# Patient Record
Sex: Male | Born: 1955
Health system: Southern US, Community
[De-identification: ages and names within clinical notes are randomized; demographics above are authoritative.]

## PROBLEM LIST (undated history)

## (undated) ENCOUNTER — Emergency Department (HOSPITAL_COMMUNITY): Admission: EM | Source: Home / Self Care

## (undated) DIAGNOSIS — E785 Hyperlipidemia, unspecified: Secondary | ICD-10-CM

## (undated) DIAGNOSIS — Z21 Asymptomatic human immunodeficiency virus [HIV] infection status: Secondary | ICD-10-CM

## (undated) DIAGNOSIS — E7800 Pure hypercholesterolemia, unspecified: Secondary | ICD-10-CM

## (undated) DIAGNOSIS — G4700 Insomnia, unspecified: Secondary | ICD-10-CM

## (undated) DIAGNOSIS — N529 Male erectile dysfunction, unspecified: Secondary | ICD-10-CM

## (undated) DIAGNOSIS — M1990 Unspecified osteoarthritis, unspecified site: Secondary | ICD-10-CM

## (undated) DIAGNOSIS — N4889 Other specified disorders of penis: Secondary | ICD-10-CM

## (undated) DIAGNOSIS — N182 Chronic kidney disease, stage 2 (mild): Secondary | ICD-10-CM

## (undated) DIAGNOSIS — K219 Gastro-esophageal reflux disease without esophagitis: Secondary | ICD-10-CM

## (undated) DIAGNOSIS — F325 Major depressive disorder, single episode, in full remission: Secondary | ICD-10-CM

## (undated) DIAGNOSIS — J189 Pneumonia, unspecified organism: Secondary | ICD-10-CM

## (undated) DIAGNOSIS — R7303 Prediabetes: Secondary | ICD-10-CM

## (undated) DIAGNOSIS — R739 Hyperglycemia, unspecified: Secondary | ICD-10-CM

## (undated) DIAGNOSIS — I2510 Atherosclerotic heart disease of native coronary artery without angina pectoris: Secondary | ICD-10-CM

## (undated) DIAGNOSIS — F419 Anxiety disorder, unspecified: Secondary | ICD-10-CM

## (undated) DIAGNOSIS — F32A Depression, unspecified: Secondary | ICD-10-CM

## (undated) DIAGNOSIS — B1920 Unspecified viral hepatitis C without hepatic coma: Secondary | ICD-10-CM

## (undated) DIAGNOSIS — F329 Major depressive disorder, single episode, unspecified: Secondary | ICD-10-CM

## (undated) DIAGNOSIS — I10 Essential (primary) hypertension: Secondary | ICD-10-CM

## (undated) DIAGNOSIS — Z6837 Body mass index (BMI) 37.0-37.9, adult: Secondary | ICD-10-CM

## (undated) DIAGNOSIS — K7460 Unspecified cirrhosis of liver: Secondary | ICD-10-CM

## (undated) DIAGNOSIS — B20 Human immunodeficiency virus [HIV] disease: Secondary | ICD-10-CM

## (undated) HISTORY — DX: Morbid (severe) obesity due to excess calories: E66.01

## (undated) HISTORY — DX: Pneumonia, unspecified organism: J18.9

## (undated) HISTORY — DX: Anxiety disorder, unspecified: F41.9

## (undated) HISTORY — DX: Major depressive disorder, single episode, in full remission: F32.5

## (undated) HISTORY — DX: Insomnia, unspecified: G47.00

## (undated) HISTORY — PX: CHOLECYSTECTOMY: SHX55

## (undated) HISTORY — DX: Hyperlipidemia, unspecified: E78.5

## (undated) HISTORY — DX: Gastro-esophageal reflux disease without esophagitis: K21.9

## (undated) HISTORY — DX: Prediabetes: R73.03

## (undated) HISTORY — DX: Unspecified osteoarthritis, unspecified site: M19.90

## (undated) HISTORY — DX: Asymptomatic human immunodeficiency virus (hiv) infection status: Z21

## (undated) HISTORY — DX: Body mass index (BMI) 37.0-37.9, adult: Z68.37

## (undated) HISTORY — DX: Atherosclerotic heart disease of native coronary artery without angina pectoris: I25.10

## (undated) HISTORY — DX: Essential (primary) hypertension: I10

## (undated) HISTORY — DX: Major depressive disorder, single episode, unspecified: F32.9

## (undated) HISTORY — DX: Pure hypercholesterolemia, unspecified: E78.00

## (undated) HISTORY — DX: Human immunodeficiency virus (HIV) disease: B20

## (undated) HISTORY — DX: Chronic kidney disease, stage 2 (mild): N18.2

## (undated) HISTORY — DX: Unspecified viral hepatitis C without hepatic coma: B19.20

## (undated) HISTORY — DX: Other specified disorders of penis: N48.89

## (undated) HISTORY — DX: Unspecified cirrhosis of liver: K74.60

## (undated) HISTORY — DX: Hyperglycemia, unspecified: R73.9

## (undated) HISTORY — DX: Male erectile dysfunction, unspecified: N52.9

## (undated) HISTORY — DX: Depression, unspecified: F32.A

---

## 2005-02-19 ENCOUNTER — Ambulatory Visit: Payer: Self-pay | Admitting: Family Medicine

## 2005-02-20 ENCOUNTER — Ambulatory Visit: Payer: Self-pay | Admitting: Internal Medicine

## 2005-11-22 ENCOUNTER — Ambulatory Visit: Payer: Self-pay | Admitting: Internal Medicine

## 2005-12-20 ENCOUNTER — Ambulatory Visit: Payer: Self-pay | Admitting: Internal Medicine

## 2006-02-26 ENCOUNTER — Ambulatory Visit: Payer: Self-pay | Admitting: Internal Medicine

## 2006-03-29 ENCOUNTER — Ambulatory Visit: Payer: Self-pay | Admitting: Internal Medicine

## 2006-03-29 LAB — CONVERTED CEMR LAB
Cholesterol: 189 mg/dL
HDL: 78 mg/dL
LDL Cholesterol: 89 mg/dL
Triglycerides: 110 mg/dL

## 2006-05-21 ENCOUNTER — Emergency Department (HOSPITAL_COMMUNITY): Admission: EM | Admit: 2006-05-21 | Discharge: 2006-05-21 | Payer: Self-pay | Admitting: Emergency Medicine

## 2006-05-28 ENCOUNTER — Ambulatory Visit: Payer: Self-pay | Admitting: Internal Medicine

## 2006-05-28 ENCOUNTER — Ambulatory Visit: Payer: Self-pay | Admitting: *Deleted

## 2006-06-03 ENCOUNTER — Ambulatory Visit: Payer: Self-pay | Admitting: Internal Medicine

## 2006-06-04 ENCOUNTER — Ambulatory Visit: Admission: RE | Admit: 2006-06-04 | Discharge: 2006-06-04 | Payer: Self-pay | Admitting: Internal Medicine

## 2006-07-17 ENCOUNTER — Encounter (INDEPENDENT_AMBULATORY_CARE_PROVIDER_SITE_OTHER): Payer: Self-pay | Admitting: Specialist

## 2006-07-17 ENCOUNTER — Ambulatory Visit (HOSPITAL_COMMUNITY): Admission: RE | Admit: 2006-07-17 | Discharge: 2006-07-17 | Payer: Self-pay | Admitting: General Surgery

## 2006-10-02 ENCOUNTER — Ambulatory Visit: Payer: Self-pay | Admitting: Internal Medicine

## 2006-10-02 LAB — CONVERTED CEMR LAB
CD4 % Helper T Cell: 15 %
CD4 T Cell Abs: 203
HCV Ab: POSITIVE
HIV 1 RNA Quant: 626 copies/mL
HIV-1 RNA Quant, Log: 2.8

## 2007-03-24 ENCOUNTER — Encounter (INDEPENDENT_AMBULATORY_CARE_PROVIDER_SITE_OTHER): Payer: Self-pay | Admitting: Internal Medicine

## 2007-03-24 DIAGNOSIS — K219 Gastro-esophageal reflux disease without esophagitis: Secondary | ICD-10-CM | POA: Insufficient documentation

## 2007-03-24 DIAGNOSIS — K8020 Calculus of gallbladder without cholecystitis without obstruction: Secondary | ICD-10-CM | POA: Insufficient documentation

## 2007-03-24 DIAGNOSIS — B20 Human immunodeficiency virus [HIV] disease: Secondary | ICD-10-CM | POA: Insufficient documentation

## 2007-03-24 LAB — CONVERTED CEMR LAB: HIV 1 RNA Quant: 626 copies/mL

## 2007-04-10 ENCOUNTER — Ambulatory Visit: Payer: Self-pay | Admitting: Internal Medicine

## 2007-04-10 LAB — CONVERTED CEMR LAB
ALT: 55 units/L — ABNORMAL HIGH (ref 0–53)
AST: 61 units/L — ABNORMAL HIGH (ref 0–37)
Albumin: 4.3 g/dL (ref 3.5–5.2)
Alkaline Phosphatase: 94 units/L (ref 39–117)
BUN: 13 mg/dL (ref 6–23)
Basophils Absolute: 0 10*3/uL (ref 0.0–0.1)
Basophils Relative: 1 % (ref 0–1)
CO2: 21 meq/L (ref 19–32)
Calcium: 9.5 mg/dL (ref 8.4–10.5)
Chloride: 105 meq/L (ref 96–112)
Cholesterol: 208 mg/dL — ABNORMAL HIGH (ref 0–200)
Creatinine, Ser: 1.19 mg/dL (ref 0.40–1.50)
Eosinophils Absolute: 0 10*3/uL (ref 0.0–0.7)
Eosinophils Relative: 1 % (ref 0–5)
Glucose, Bld: 103 mg/dL — ABNORMAL HIGH (ref 70–99)
HCT: 43.8 % (ref 39.0–52.0)
HDL: 59 mg/dL (ref >39)
HIV 1 RNA Quant: 461 copies/mL — ABNORMAL HIGH (ref <50)
HIV-1 RNA Quant, Log: 2.66 — ABNORMAL HIGH (ref <1.70)
Hemoglobin: 16 g/dL (ref 13.0–17.0)
LDL Cholesterol: 121 mg/dL — ABNORMAL HIGH (ref 0–99)
Lymphocytes Relative: 48 % — ABNORMAL HIGH (ref 12–46)
Lymphs Abs: 1.8 10*3/uL (ref 0.7–3.3)
MCHC: 36.5 g/dL — ABNORMAL HIGH (ref 30.0–36.0)
MCV: 102.6 fL — ABNORMAL HIGH (ref 78.0–100.0)
Monocytes Absolute: 0.3 10*3/uL (ref 0.2–0.7)
Monocytes Relative: 7 % (ref 3–11)
Neutro Abs: 1.7 10*3/uL (ref 1.7–7.7)
Neutrophils Relative %: 44 % (ref 43–77)
Platelets: 232 10*3/uL (ref 150–400)
Potassium: 3.8 meq/L (ref 3.5–5.3)
RBC: 4.27 M/uL (ref 4.22–5.81)
RDW: 13.4 % (ref 11.5–14.0)
Sodium: 138 meq/L (ref 135–145)
Total Bilirubin: 0.7 mg/dL (ref 0.3–1.2)
Total CHOL/HDL Ratio: 3.5
Total Protein: 8.1 g/dL (ref 6.0–8.3)
Triglycerides: 140 mg/dL (ref <150)
VLDL: 28 mg/dL (ref 0–40)
WBC: 3.9 10*3/uL — ABNORMAL LOW (ref 4.0–10.5)

## 2007-04-30 ENCOUNTER — Encounter (INDEPENDENT_AMBULATORY_CARE_PROVIDER_SITE_OTHER): Payer: Self-pay | Admitting: *Deleted

## 2007-08-04 ENCOUNTER — Ambulatory Visit: Payer: Self-pay | Admitting: Internal Medicine

## 2007-11-12 ENCOUNTER — Ambulatory Visit: Payer: Self-pay | Admitting: Internal Medicine

## 2007-11-12 LAB — CONVERTED CEMR LAB
ALT: 77 units/L — ABNORMAL HIGH (ref 0–53)
AST: 81 units/L — ABNORMAL HIGH (ref 0–37)
Absolute CD4: 92 #/uL — ABNORMAL LOW (ref 381–1469)
Albumin: 4 g/dL (ref 3.5–5.2)
Alkaline Phosphatase: 92 units/L (ref 39–117)
BUN: 10 mg/dL (ref 6–23)
CD4 T Helper %: 8 % — ABNORMAL LOW (ref 32–62)
CO2: 22 meq/L (ref 19–32)
Calcium: 9.2 mg/dL (ref 8.4–10.5)
Chloride: 105 meq/L (ref 96–112)
Creatinine, Ser: 1.04 mg/dL (ref 0.40–1.50)
Glucose, Bld: 97 mg/dL (ref 70–99)
HIV 1 RNA Quant: 698 copies/mL — ABNORMAL HIGH (ref <50)
HIV-1 RNA Quant, Log: 2.84 — ABNORMAL HIGH (ref <1.70)
Potassium: 3.8 meq/L (ref 3.5–5.3)
Sodium: 138 meq/L (ref 135–145)
Total Bilirubin: 0.6 mg/dL (ref 0.3–1.2)
Total Lymphocytes %: 41 % (ref 12–46)
Total Protein: 7.7 g/dL (ref 6.0–8.3)
Total lymphocyte count: 1148 cells/mcL (ref 700–3300)
WBC, lymph enumeration: 2.8 10*3/uL — ABNORMAL LOW (ref 4.0–10.5)

## 2007-12-19 ENCOUNTER — Ambulatory Visit: Payer: Self-pay | Admitting: Internal Medicine

## 2007-12-19 LAB — CONVERTED CEMR LAB: PSA: 0.45 ng/mL (ref 0.10–4.00)

## 2007-12-31 ENCOUNTER — Ambulatory Visit: Payer: Self-pay | Admitting: Internal Medicine

## 2008-01-01 ENCOUNTER — Encounter (INDEPENDENT_AMBULATORY_CARE_PROVIDER_SITE_OTHER): Payer: Self-pay | Admitting: Internal Medicine

## 2008-01-01 LAB — CONVERTED CEMR LAB
HIV 1 RNA Quant: 755 copies/mL — ABNORMAL HIGH (ref <50)
HIV-1 RNA Quant, Log: 2.88 — ABNORMAL HIGH (ref <1.70)

## 2008-01-28 ENCOUNTER — Ambulatory Visit: Payer: Self-pay | Admitting: Internal Medicine

## 2008-02-05 ENCOUNTER — Ambulatory Visit: Payer: Self-pay | Admitting: Internal Medicine

## 2008-07-01 ENCOUNTER — Ambulatory Visit: Payer: Self-pay | Admitting: Internal Medicine

## 2008-07-01 LAB — CONVERTED CEMR LAB
ALT: 65 units/L — ABNORMAL HIGH (ref 0–53)
AST: 64 units/L — ABNORMAL HIGH (ref 0–37)
Absolute CD4: 125 #/uL — ABNORMAL LOW (ref 381–1469)
Albumin: 4.1 g/dL (ref 3.5–5.2)
Alkaline Phosphatase: 139 units/L — ABNORMAL HIGH (ref 39–117)
BUN: 13 mg/dL (ref 6–23)
Basophils Absolute: 0 10*3/uL (ref 0.0–0.1)
Basophils Relative: 0 % (ref 0–1)
CD4 T Helper %: 9 % — ABNORMAL LOW (ref 32–62)
CO2: 25 meq/L (ref 19–32)
Calcium: 8.9 mg/dL (ref 8.4–10.5)
Chloride: 107 meq/L (ref 96–112)
Creatinine, Ser: 1.18 mg/dL (ref 0.40–1.50)
Eosinophils Absolute: 0 10*3/uL (ref 0.0–0.7)
Eosinophils Relative: 1 % (ref 0–5)
Glucose, Bld: 96 mg/dL (ref 70–99)
HCT: 47.3 % (ref 39.0–52.0)
HIV 1 RNA Quant: <48 copies/mL (ref <48)
HIV-1 RNA Quant, Log: <1.68 (ref <1.68)
Hemoglobin: 16.4 g/dL (ref 13.0–17.0)
Lymphocytes Relative: 41 % (ref 12–46)
Lymphs Abs: 1.4 10*3/uL (ref 0.7–4.0)
MCHC: 34.7 g/dL (ref 30.0–36.0)
MCV: 95.6 fL (ref 78.0–100.0)
Monocytes Absolute: 0.3 10*3/uL (ref 0.1–1.0)
Monocytes Relative: 10 % (ref 3–12)
Neutro Abs: 1.6 10*3/uL — ABNORMAL LOW (ref 1.7–7.7)
Neutrophils Relative %: 47 % (ref 43–77)
Platelets: 203 10*3/uL (ref 150–400)
Potassium: 4 meq/L (ref 3.5–5.3)
RBC: 4.95 M/uL (ref 4.22–5.81)
RDW: 13.3 % (ref 11.5–15.5)
Sodium: 141 meq/L (ref 135–145)
Total Bilirubin: 0.7 mg/dL (ref 0.3–1.2)
Total Lymphocytes %: 41 % (ref 12–46)
Total Protein: 7.3 g/dL (ref 6.0–8.3)
Total lymphocyte count: 1394 cells/mcL (ref 700–3300)
WBC, lymph enumeration: 3.4 10*3/uL — ABNORMAL LOW (ref 4.0–10.5)
WBC: 3.4 10*3/uL — ABNORMAL LOW (ref 4.0–10.5)

## 2008-12-21 ENCOUNTER — Ambulatory Visit: Payer: Self-pay | Admitting: Internal Medicine

## 2009-10-20 ENCOUNTER — Encounter: Payer: Self-pay | Admitting: Internal Medicine

## 2009-10-20 ENCOUNTER — Ambulatory Visit: Payer: Self-pay | Admitting: Internal Medicine

## 2009-10-20 DIAGNOSIS — R609 Edema, unspecified: Secondary | ICD-10-CM | POA: Insufficient documentation

## 2009-10-20 LAB — CONVERTED CEMR LAB
ALT: 43 units/L (ref 0–53)
AST: 55 units/L — ABNORMAL HIGH (ref 0–37)
Absolute CD4: 162 #/uL — ABNORMAL LOW (ref 381–1469)
Albumin: 4.2 g/dL (ref 3.5–5.2)
Alkaline Phosphatase: 113 units/L (ref 39–117)
BUN: 11 mg/dL (ref 6–23)
Basophils Absolute: 0 10*3/uL (ref 0.0–0.1)
Basophils Relative: 1 % (ref 0–1)
CD4 T Helper %: 10 % — ABNORMAL LOW (ref 32–62)
CO2: 23 meq/L (ref 19–32)
Calcium: 9.4 mg/dL (ref 8.4–10.5)
Chlamydia, Swab/Urine, PCR: NEGATIVE
Chloride: 105 meq/L (ref 96–112)
Creatinine, Ser: 1.08 mg/dL (ref 0.40–1.50)
Eosinophils Absolute: 0 10*3/uL (ref 0.0–0.7)
Eosinophils Relative: 1 % (ref 0–5)
GC Probe Amp, Urine: NEGATIVE
Glucose, Bld: 100 mg/dL — ABNORMAL HIGH (ref 70–99)
HCT: 40.8 % (ref 39.0–52.0)
HCV Ab: REACTIVE — AB
HIV 1 RNA Quant: <48 copies/mL (ref <48)
HIV-1 RNA Quant, Log: <1.68 (ref <1.68)
Hemoglobin: 14.4 g/dL (ref 13.0–17.0)
Hep B S Ab: NEGATIVE
Hepatitis B Surface Ag: NEGATIVE
Lymphocytes Relative: 45 % (ref 12–46)
Lymphs Abs: 1.6 10*3/uL (ref 0.7–4.0)
MCHC: 35.3 g/dL (ref 30.0–36.0)
MCV: 93.2 fL (ref 78.0–100.0)
Monocytes Absolute: 0.4 10*3/uL (ref 0.1–1.0)
Monocytes Relative: 11 % (ref 3–12)
Neutro Abs: 1.5 10*3/uL — ABNORMAL LOW (ref 1.7–7.7)
Neutrophils Relative %: 42 % — ABNORMAL LOW (ref 43–77)
Platelets: 195 10*3/uL (ref 150–400)
Potassium: 3.8 meq/L (ref 3.5–5.3)
RBC: 4.38 M/uL (ref 4.22–5.81)
RDW: 12.6 % (ref 11.5–15.5)
Sodium: 138 meq/L (ref 135–145)
Total Bilirubin: 0.8 mg/dL (ref 0.3–1.2)
Total Protein: 7.5 g/dL (ref 6.0–8.3)
Total lymphocyte count: 1620 cells/mcL (ref 700–3300)
WBC: 3.6 10*3/uL — ABNORMAL LOW (ref 4.0–10.5)

## 2009-10-21 ENCOUNTER — Encounter: Payer: Self-pay | Admitting: Internal Medicine

## 2009-10-31 ENCOUNTER — Ambulatory Visit (HOSPITAL_COMMUNITY): Admission: RE | Admit: 2009-10-31 | Discharge: 2009-10-31 | Payer: Self-pay | Admitting: Internal Medicine

## 2009-11-14 ENCOUNTER — Ambulatory Visit: Payer: Self-pay | Admitting: Internal Medicine

## 2009-11-14 DIAGNOSIS — M79609 Pain in unspecified limb: Secondary | ICD-10-CM | POA: Insufficient documentation

## 2009-12-29 ENCOUNTER — Telehealth: Payer: Self-pay | Admitting: Internal Medicine

## 2010-01-10 ENCOUNTER — Encounter (INDEPENDENT_AMBULATORY_CARE_PROVIDER_SITE_OTHER): Payer: Self-pay | Admitting: *Deleted

## 2010-02-08 ENCOUNTER — Ambulatory Visit: Payer: Self-pay | Admitting: Internal Medicine

## 2010-02-08 LAB — CONVERTED CEMR LAB
ALT: 54 units/L — ABNORMAL HIGH (ref 0–53)
AST: 63 units/L — ABNORMAL HIGH (ref 0–37)
Albumin: 4.2 g/dL (ref 3.5–5.2)
Alkaline Phosphatase: 104 units/L (ref 39–117)
BUN: 13 mg/dL (ref 6–23)
Basophils Absolute: 0 10*3/uL (ref 0.0–0.1)
Basophils Relative: 1 % (ref 0–1)
CO2: 23 meq/L (ref 19–32)
Calcium: 9.2 mg/dL (ref 8.4–10.5)
Chloride: 105 meq/L (ref 96–112)
Cholesterol: 145 mg/dL (ref 0–200)
Creatinine, Ser: 1.18 mg/dL (ref 0.40–1.50)
Eosinophils Absolute: 0 10*3/uL (ref 0.0–0.7)
Eosinophils Relative: 1 % (ref 0–5)
Glucose, Bld: 109 mg/dL — ABNORMAL HIGH (ref 70–99)
HCT: 42.1 % (ref 39.0–52.0)
HDL: 55 mg/dL (ref >39)
HIV 1 RNA Quant: <48 copies/mL (ref <48)
HIV-1 RNA Quant, Log: <1.68 (ref <1.68)
Hemoglobin: 15.1 g/dL (ref 13.0–17.0)
LDL Cholesterol: 76 mg/dL (ref 0–99)
Lymphocytes Relative: 36 % (ref 12–46)
Lymphs Abs: 1.4 10*3/uL (ref 0.7–4.0)
MCHC: 35.9 g/dL (ref 30.0–36.0)
MCV: 93.8 fL (ref 78.0–100.0)
Monocytes Absolute: 0.3 10*3/uL (ref 0.1–1.0)
Monocytes Relative: 7 % (ref 3–12)
Neutro Abs: 2.1 10*3/uL (ref 1.7–7.7)
Neutrophils Relative %: 55 % (ref 43–77)
Platelets: 187 10*3/uL (ref 150–400)
Potassium: 3.7 meq/L (ref 3.5–5.3)
RBC: 4.49 M/uL (ref 4.22–5.81)
RDW: 13.6 % (ref 11.5–15.5)
Sodium: 137 meq/L (ref 135–145)
Total Bilirubin: 0.9 mg/dL (ref 0.3–1.2)
Total CHOL/HDL Ratio: 2.6
Total Protein: 7.4 g/dL (ref 6.0–8.3)
Triglycerides: 68 mg/dL (ref <150)
VLDL: 14 mg/dL (ref 0–40)
WBC: 3.9 10*3/uL — ABNORMAL LOW (ref 4.0–10.5)

## 2010-03-08 ENCOUNTER — Encounter: Payer: Self-pay | Admitting: Internal Medicine

## 2010-03-23 ENCOUNTER — Encounter (INDEPENDENT_AMBULATORY_CARE_PROVIDER_SITE_OTHER): Payer: Self-pay | Admitting: *Deleted

## 2010-03-23 ENCOUNTER — Ambulatory Visit: Payer: Self-pay | Admitting: Internal Medicine

## 2010-03-31 ENCOUNTER — Encounter (INDEPENDENT_AMBULATORY_CARE_PROVIDER_SITE_OTHER): Payer: Self-pay | Admitting: *Deleted

## 2010-04-14 ENCOUNTER — Encounter (INDEPENDENT_AMBULATORY_CARE_PROVIDER_SITE_OTHER): Payer: Self-pay | Admitting: *Deleted

## 2010-04-19 ENCOUNTER — Ambulatory Visit: Payer: Self-pay | Admitting: Gastroenterology

## 2010-05-03 ENCOUNTER — Ambulatory Visit: Payer: Self-pay | Admitting: Gastroenterology

## 2010-05-26 ENCOUNTER — Encounter: Payer: Self-pay | Admitting: Internal Medicine

## 2010-08-28 ENCOUNTER — Ambulatory Visit
Admission: RE | Admit: 2010-08-28 | Discharge: 2010-08-28 | Payer: Self-pay | Source: Home / Self Care | Attending: Internal Medicine | Admitting: Internal Medicine

## 2010-08-28 ENCOUNTER — Encounter: Payer: Self-pay | Admitting: Internal Medicine

## 2010-08-28 LAB — CONVERTED CEMR LAB
ALT: 51 units/L (ref 0–53)
AST: 52 units/L — ABNORMAL HIGH (ref 0–37)
Albumin: 4.4 g/dL (ref 3.5–5.2)
Alkaline Phosphatase: 105 units/L (ref 39–117)
BUN: 12 mg/dL (ref 6–23)
Basophils Absolute: 0 10*3/uL (ref 0.0–0.1)
Basophils Relative: 1 % (ref 0–1)
CO2: 26 meq/L (ref 19–32)
Calcium: 9.5 mg/dL (ref 8.4–10.5)
Chloride: 101 meq/L (ref 96–112)
Creatinine, Ser: 1.14 mg/dL (ref 0.40–1.50)
Eosinophils Absolute: 0.1 10*3/uL (ref 0.0–0.7)
Eosinophils Relative: 2 % (ref 0–5)
Glucose, Bld: 108 mg/dL — ABNORMAL HIGH (ref 70–99)
HCT: 46 % (ref 39.0–52.0)
HIV 1 RNA Quant: <20 copies/mL (ref <20)
HIV-1 RNA Quant, Log: <1.3 (ref <1.30)
Hemoglobin: 15.7 g/dL (ref 13.0–17.0)
Lymphocytes Relative: 44 % (ref 12–46)
Lymphs Abs: 1.7 10*3/uL (ref 0.7–4.0)
MCHC: 34.1 g/dL (ref 30.0–36.0)
MCV: 97.5 fL (ref 78.0–100.0)
Monocytes Absolute: 0.4 10*3/uL (ref 0.1–1.0)
Monocytes Relative: 9 % (ref 3–12)
Neutro Abs: 1.8 10*3/uL (ref 1.7–7.7)
Neutrophils Relative %: 45 % (ref 43–77)
PSA: 0.48 ng/mL (ref <=4.00)
Platelets: 189 10*3/uL (ref 150–400)
Potassium: 4 meq/L (ref 3.5–5.3)
RBC: 4.72 M/uL (ref 4.22–5.81)
RDW: 13.1 % (ref 11.5–15.5)
Sodium: 139 meq/L (ref 135–145)
Total Bilirubin: 0.9 mg/dL (ref 0.3–1.2)
Total Protein: 7.7 g/dL (ref 6.0–8.3)
WBC: 3.9 10*3/uL — ABNORMAL LOW (ref 4.0–10.5)

## 2010-08-30 LAB — T-HELPER CELL (CD4) - (RCID CLINIC ONLY)
CD4 % Helper T Cell: 12 % — ABNORMAL LOW (ref 33–55)
CD4 T Cell Abs: 240 uL — ABNORMAL LOW (ref 400–2700)

## 2010-09-07 ENCOUNTER — Encounter (INDEPENDENT_AMBULATORY_CARE_PROVIDER_SITE_OTHER): Payer: Self-pay | Admitting: *Deleted

## 2010-09-11 ENCOUNTER — Ambulatory Visit
Admission: RE | Admit: 2010-09-11 | Discharge: 2010-09-11 | Payer: Self-pay | Source: Home / Self Care | Attending: Internal Medicine | Admitting: Internal Medicine

## 2010-09-11 DIAGNOSIS — M159 Polyosteoarthritis, unspecified: Secondary | ICD-10-CM | POA: Insufficient documentation

## 2010-09-12 NOTE — Miscellaneous (Signed)
Summary: RW Update  Clinical Lists Changes  Observations: Added new observation of DATE1STVISIT: 11/14/2009 (03/08/2010 12:14) Added new observation of RWPARTICIP: Yes (03/08/2010 12:14)

## 2010-09-12 NOTE — Assessment & Plan Note (Signed)
Summary: f/u labs   CC:  follow-up visit, lab results and xray results, and Depression.  History of Present Illness: Pt here for f/u lab results.  He continues to have pain in his hand and it locks up sometimes particularly at night.  Depression History:      The patient denies a depressed mood most of the day and a diminished interest in his usual daily activities.        The patient denies that he feels like life is not worth living, denies that he wishes that he were dead, and denies that he has thought about ending his life.        Preventive Screening-Counseling & Management  Alcohol-Tobacco     Alcohol drinks/day: occasional     Alcohol type: wine     Smoking Status: quit  Caffeine-Diet-Exercise     Caffeine use/day: occasional     Does Patient Exercise: yes     Type of exercise: light weights     Exercise (avg: min/session): <30     Times/week: <3   Updated Prior Medication List: SMZ-TMP DS 800-160 MG  TABS (SULFAMETHOXAZOLE-TRIMETHOPRIM) 1 by mouth q m/w/f TRIAMTERENE-HCTZ 37.5-25 MG TABS (TRIAMTERENE-HCTZ) Take 1 tablet by mouth once a day TERAZOSIN HCL 2 MG CAPS (TERAZOSIN HCL) Take 1 tablet by mouth once a day ISENTRESS 400 MG TABS (RALTEGRAVIR POTASSIUM) Take 1 tablet by mouth two times a day TRUVADA 200-300 MG TABS (EMTRICITABINE-TENOFOVIR) Take 1 tablet by mouth once a day PROZAC 40 MG CAPS (FLUOXETINE HCL) Take 1 tablet by mouth once a day ATENOLOL 100 MG TABS (ATENOLOL) Take 1 tablet by mouth once a day ULTRAM 50 MG TABS (TRAMADOL HCL) Take 1 tablet by mouth every 8 hours as needed  Current Allergies (reviewed today): No known allergies  Past History:  Past Medical History: Last updated: 03/24/2007 Current Problems:  CHOLELITHIASIS (ICD-574.20) GERD (ICD-530.81) HIV INFECTION (ICD-042) INCIDNETAL ADRENAL ADENOMA NOTED U/S 12/06 Durwin Nora [place]) 10/07 U/S DOES NOT NOTE  Review of Systems  The patient denies fever, dyspnea on exertion, and headaches.      Vital Signs:  Patient profile:   55 year old male Height:      71 inches (180.34 cm) Weight:      250.8 pounds (114 kg) BMI:     35.11 Temp:     98.1 degrees F (36.72 degrees C) oral Pulse rate:   52 / minute BP sitting:   144 / 82  (right arm)  Vitals Entered By: [name] CMA Duncan Dull) (November 14, 2009 9:48 AM) CC: follow-up visit, lab results and xray results, Depression Is Patient Diabetic? No Pain Assessment Patient in pain? yes     Location: left hand Intensity: 8 Type: aching Onset of pain  Constant Nutritional Status BMI of > 30 = obese Nutritional Status Detail appetite "good"  Does patient need assistance? Functional Status Self care Ambulation Normal Comments four missed doses of meds since last visit   Physical Exam  General:  alert, well-developed, well-nourished, and well-hydrated.   Head:  normocephalic and atraumatic.   Lungs:  normal breath sounds.      Impression & Recommendations:  Problem # 1:  HIV INFECTION (ICD-042)  Pt.s most recent CD4ct was 162 and VL <48 .  Pt instructed to continue the current antiretroviral regimen.  Pt encouraged to take medication regularly and not miss doses.  Pt will f/u in 3 months for repeat blood work and will see me 2 weeks later. Hepatits  B vaccine #1 given today.  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 Patient Level IV (56213) Orthopedic Referral (Ortho)Future Orders: T-CD4SP ([facility]) (CD4SP) ... 02/12/2010 T-HIV Viral Load [id number]) ... 02/12/2010 T-Comprehensive Metabolic Panel [id number]) ... 02/12/2010 T-CBC w/Diff ([id number]) ... 02/12/2010 T-Lipid Profile [id number]) ... 02/12/2010  Problem # 2:  HAND PAIN, LEFT  (ICD-729.5) refer to ortho ultram for pain  Medications Added to Medication List This Visit: 1)  Atenolol 100 Mg Tabs (Atenolol) .... Take 1 tablet by mouth once a day 2)  Ultram 50 Mg Tabs (Tramadol hcl) .... Take 1 tablet by mouth every 8 hours as needed  Other Orders: Hepatitis B Vaccine >71yrs (95638) Admin 1st Vaccine (75643)  Patient Instructions: 1)  Please schedule a follow-up appointment in 3 months, 2 weeks after labs.  Prescriptions: ULTRAM 50 MG TABS (TRAMADOL HCL) Take 1 tablet by mouth every 8 hours as needed  #60 x 5   Entered and Authorized by:   Yisroel Ramming MD   Signed by:   Yisroel Ramming MD on 11/14/2009   Method used:   Print then Give to Patient   RxID:   [id number]      Immunizations Administered:  Hepatitis B Vaccine # 1:    Vaccine Type: HepB Adult    Site: right deltoid    Mfr: Merck    Dose: 0.5 ml    Route: IM    Given by: [name] CMA ( AAMA)    Exp. Date: 12/15/2011    Lot #: [id number]    VIS given: 02/27/06 version given November 14, 2009.

## 2010-09-12 NOTE — Procedures (Signed)
Summary: Colonoscopy  Patient: Erik Frederick Note: All result statuses are Final unless otherwise noted.  Tests: (1) Colonoscopy (COL)   COL Colonoscopy           DONE     [facility]     [address]     [place], [place]  [address]           COLONOSCOPY PROCEDURE REPORT           PATIENT:  Erik Frederick, Erik Frederick  MR#:  [id number]     BIRTHDATE:  May 19, 1956, 54 yrs. old  GENDER:  male     ENDOSCOPIST:  Vania Rea. Jarold Motto, MD, [facility]     REF. BY:  Yisroel Ramming, M.D.     PROCEDURE DATE:  05/03/2010     PROCEDURE:  Average-risk screening colonoscopy     G0121     ASA CLASS:  Class II     INDICATIONS:  Routine Risk Screening     MEDICATIONS:   Fentanyl 50 mcg IV, There was residual sedation     effect present from prior procedure. 8 mg IV, Benadryl 25 mg IV           DESCRIPTION OF PROCEDURE:   After the risks benefits and     alternatives of the procedure were thoroughly explained, informed     consent was obtained.  Digital rectal exam was performed and     revealed no abnormalities.   The LB [id number] endoscope     was introduced through the anus and advanced to the cecum, which     was identified by both the appendix and ileocecal valve, without     limitations.  The quality of the prep was good, using MoviPrep.     The instrument was then slowly withdrawn as the colon was fully     examined.     <<PROCEDUREIMAGES>>           FINDINGS:  No polyps or cancers were seen.  This was otherwise a     normal examination of the colon.   Retroflexed views in the rectum     revealed no abnormalities.    The scope was then withdrawn from     the patient and the procedure completed.           COMPLICATIONS:  None     ENDOSCOPIC IMPRESSION:     1) No polyps or cancers     2) Otherwise normal examination     RECOMMENDATIONS:     1) Continue current colorectal screening recommendations for     "routine risk" patients with a repeat colonoscopy in 10 years.     REPEAT EXAM:   No           ______________________________     Vania Rea. Jarold Motto, MD, [facility]           CC:           n.     eSIGNED:   Vania Rea. Vielka Klinedinst at 05/03/2010 10:45 AM           Davy Pique, [id number]  Note: An exclamation mark (!) indicates a result that was not dispersed into the flowsheet. Document Creation Date: 05/03/2010 10:46 AM _______________________________________________________________________  (1) Order result status: Final Collection or observation date-time: 05/03/2010 10:42 Requested date-time:  Receipt date-time:  Reported date-time:  Referring Physician:   Ordering Physician: Sheryn Bison [phone]) Specimen Source:  Source: Launa Grill Order Number: [id number] Lab site:   Appended  Document: Colonoscopy    Clinical Lists Changes  Observations: Added new observation of COLONNXTDUE: 04/2020 (05/03/2010 10:49)

## 2010-09-12 NOTE — Letter (Signed)
Summary: [facility]: Medical Case Mgt. Referral Form  [facility]: Medical Case Mgt. Referral Form   Imported By: Florinda Marker 10/26/2009 14:11:11  _____________________________________________________________________  External Attachment:    Type:   Image     Comment:   External Document

## 2010-09-12 NOTE — Miscellaneous (Signed)
Summary: VIP  Patient: JEFFREY A Lundstrom Note: All result statuses are Final unless otherwise noted.  Tests: (1) VIP (Medications)   LLIMPORTMEDS              "Result Below..."       RESULT: WELLBUTRIN XL TB24 150 MG*TAKE ONE (1) TABLET BY MOUTH EVERY DAY  GENE SMOKING CESSASION*09/05/2006*Last Refill: 12/26/2006*[id number]*******   LLIMPORTMEDS              "Result Below..."       RESULT: PRAVACHOL TABS 40 MG*TAKE ONE (1) TABLET EVERY MORNING*10/03/2006*Last Refill: 01/08/2007*[id number]*******   LLIMPORTALLS              NKDA***  Note: An exclamation mark (!) indicates a result that was not dispersed into the flowsheet. Document Creation Date: 06/12/2007 2:58 PM _______________________________________________________________________  (1) Order result status: Final Collection or observation date-time: 04/30/2007 Requested date-time: 04/30/2007 Receipt date-time:  Reported date-time: 04/30/2007 Referring Physician:   Ordering Physician:   Specimen Source:  Source: [place] Order Number:  Lab site:

## 2010-09-12 NOTE — Letter (Signed)
Summary: [facility] Instructions  [facility]  [address] [place], [place] [address]   Phone: [phone]  Fax: [phone]       Erik Frederick    February 15, 1956    MRN: [id number]        Procedure Day Dorna Bloom: Wednesday  05-03-10     Arrival Time: 9:30 a.m.     Procedure Time: 10:30 a.m.     Location of Procedure:                    _x _  [facility] (4th Floor)   PREPARATION FOR COLONOSCOPY WITH MOVIPREP   Starting 5 days prior to your procedure  04-28-10  do not eat nuts, seeds, popcorn, corn, beans, peas,  salads, or any raw vegetables.  Do not take any fiber supplements (e.g. Metamucil, Citrucel, and Benefiber).  THE DAY BEFORE YOUR PROCEDURE         DATE:  05-02-10   DAY: Tuesday  1.  Drink clear liquids the entire day-NO SOLID FOOD  2.  Do not drink anything colored red or purple.  Avoid juices with pulp.  No orange juice.  3.  Drink at least 64 oz. (8 glasses) of fluid/clear liquids during the day to prevent dehydration and help the prep work efficiently.  CLEAR LIQUIDS INCLUDE: Water Jello Ice Popsicles Tea (sugar ok, no milk/cream) Powdered fruit flavored drinks Coffee (sugar ok, no milk/cream) Gatorade Juice: apple, white grape, white cranberry  Lemonade Clear bullion, consomm, broth Carbonated beverages (any kind) Strained chicken noodle soup Hard Candy                             4.  In the morning, mix first dose of MoviPrep solution:    Empty 1 Pouch A and 1 Pouch B into the disposable container    Add lukewarm drinking water to the top line of the container. Mix to dissolve    Refrigerate (mixed solution should be used within 24 hrs)  5.  Begin drinking the prep at 5:00 p.m. The MoviPrep container is divided by 4 marks.   Every 15 minutes drink the solution down to the next mark (approximately 8 oz) until the full liter is complete.   6.  Follow completed prep with 16 oz of clear liquid of your choice (Nothing red or  purple).  Continue to drink clear liquids until bedtime.  7.  Before going to bed, mix second dose of MoviPrep solution:    Empty 1 Pouch A and 1 Pouch B into the disposable container    Add lukewarm drinking water to the top line of the container. Mix to dissolve    Refrigerate  THE DAY OF YOUR PROCEDURE      DATE:  05-03-10  DAY:  Wednesday  Beginning at  5:30 a.m. (5 hours before procedure):         1. Every 15 minutes, drink the solution down to the next mark (approx 8 oz) until the full liter is complete.  2. Follow completed prep with 16 oz. of clear liquid of your choice.    3. You may drink clear liquids until  8:30 a.m.  (2 HOURS BEFORE PROCEDURE).   MEDICATION INSTRUCTIONS  Unless otherwise instructed, you should take regular prescription medications with a small sip of water   as early as possible the morning of your procedure.   Additional medication instructions: Hold Triamterene/HCTZ the  morning of procedure.         OTHER INSTRUCTIONS  You will need a responsible adult at least 56 years of age to accompany you and drive you home.   This person must remain in the waiting room during your procedure.  Wear loose fitting clothing that is easily removed.  Leave jewelry and other valuables at home.  However, you may wish to bring a book to read or  an iPod/MP3 player to listen to music as you wait for your procedure to start.  Remove all body piercing jewelry and leave at home.  Total time from sign-in until discharge is approximately 2-3 hours.  You should go home directly after your procedure and rest.  You can resume normal activities the  day after your procedure.  The day of your procedure you should not:   Drive   Make legal decisions   Operate machinery   Drink alcohol   Return to work  You will receive specific instructions about eating, activities and medications before you leave.    The above instructions have been reviewed and  explained to me by   Wyona Almas RN  April 19, 2010 1:58 PM     I fully understand and can verbalize these instructions _____________________________ Date _________

## 2010-09-12 NOTE — Miscellaneous (Signed)
Summary: Office Visit ([facility])    Vital Signs:  Patient profile:   55 year old male Weight:      241.5 pounds Temp:     98.4 degrees F oral Pulse rate:   56 / minute Pulse rhythm:   regular Resp:     20 per minute BP sitting:   96 / 58  (left arm)  Vitals Entered By: Sharen Heck RN (October 20, 2009 3:02 PM) CC: f/u 05, last seen 2010, bil hand pain, bil lower leg swelling x 1 month Is Patient Diabetic? No Pain Assessment Patient in pain? yes     Location: bil hand pain Intensity: 8 Type: aching  Does patient need assistance? Functional Status Self care Ambulation Normal   CC:  f/u 05, last seen 2010, bil hand pain, and bil lower leg swelling x 1 month.  History of Present Illness: Pt here to re-establish care. H ehas been taking care of his father and nor really taking care of himself.  Recently he noticed some swelling in his feet without pain.  He denies CP or SOB. He also notes some stiffness and pain in his hands left > right. He has not been taking his HIV meds.  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 1 By Mouth Once Daily 2)  Smz-Tmp Ds 800-160 Mg  Tabs (Sulfamethoxazole-Trimethoprim) .Marland Kitchen.. 1 By Mouth Q M/w/f 3)  Triamterene-Hctz 37.5-25 Mg Tabs (Triamterene-Hctz) .... Take 1 Tablet By Mouth Once A Day 4)  Terazosin Hcl 2 Mg Caps (Terazosin Hcl) .... Take 1 Tablet By Mouth Once A Day 5)  Isentress 400 Mg Tabs (Raltegravir Potassium) .... Take 1 Tablet  By Mouth Two Times A Day 6)  Truvada 200-300 Mg Tabs (Emtricitabine-Tenofovir) .... Take 1 Tablet By Mouth Once A Day 7)  Prozac 40 Mg Caps (Fluoxetine Hcl) .... Take 1 Tablet By Mouth Once A Day  Allergies (verified): No Known Drug Allergies   Review of Systems  The patient denies anorexia, fever, weight loss, chest pain, and dyspnea on exertion.     Physical Exam  General:  alert, well-developed, well-nourished, and well-hydrated.   Head:  normocephalic and atraumatic.   Mouth:  pharynx pink and moist.  no thrush  Lungs:  normal breath sounds.   Heart:  normal rate and regular rhythm.   Msk:  some tendernes over MCP joints Extremities:  1+ left pedal edema and 1+ right pedal edema.          Medication Adherence: 10/20/2009   Adherence to medications reviewed with patient. Counseling to provide adequate adherence provided   Prevention For Positives: 10/20/2009   Safe sex practices discussed with patient. Condoms offered.                             Impression & Recommendations:  Problem # 1:  HIV INFECTION (ICD-042) Will obtain labs and refer  to [facility] to get back on ADAP so he can re-start his meds. He will return in 2 weeks. Orders: Est. Patient Level III ([address]) T-CBC w/Diff [phone]) T-CD4SP [facility]) (CD4SP) T-Chlamydia  Probe, urine [id number]) T-GC Probe, urine [phone]) T-Comprehensive Metabolic Panel [phone]) T-Hepatitis B Surface Antigen [phone]) T-Hepatitis B Surface Antibody [phone]) T-Hepatitis C Antibody (33295-18841) T-HIV Viral Load [phone]) T-RPR (Syphilis) (09323-55732)  Problem # 2:  ARTHRITIS, HAND (KGU-542.70) will obtain an x-ray ? arthritis  Problem # 3:  PERIPHERAL EDEMA (ICD-782.3) Will check kidney and liver function. restart diuretics The following medications were removed from the medication list:    Hydrochlorothiazide 25 Mg Tabs (Hydrochlorothiazide) .Marland Kitchen... Take 1 tablet by mouth once a  day His updated medication list for this problem includes:    Triamterene-hctz 37.5-25 Mg Tabs (Triamterene-hctz) .Marland Kitchen... Take 1 tablet by mouth once a day  Medications Added to Medication List This Visit: 1)  Triamterene-hctz 37.5-25 Mg Tabs (Triamterene-hctz) .... Take 1 tablet by mouth once a day 2)  Terazosin Hcl 2 Mg Caps (Terazosin hcl) .... Take 1 tablet by mouth once a day 3)  Isentress 400 Mg Tabs (Raltegravir potassium) .... Take 1 tablet by mouth two times a day 4)  Truvada 200-300 Mg Tabs (Emtricitabine-tenofovir) .... Take 1 tablet by mouth once a day 5)  Prozac 40 Mg Caps (Fluoxetine hcl) .... Take 1 tablet by mouth once a day   Patient Instructions: 1)  Please schedule a follow-up appointment in 2 weeks. Prescriptions: PROZAC 40 MG CAPS (FLUOXETINE HCL) Take 1 tablet by mouth once a day  #30 x 5   Entered and Authorized by:   Yisroel Ramming MD   Signed by:   Yisroel Ramming MD on 10/20/2009   Method used:   Print then Give to Patient   RxID:   [id number] TRUVADA 200-300 MG TABS (EMTRICITABINE-TENOFOVIR) Take 1 tablet by mouth once a day  #30 x 5   Entered and Authorized by:   Yisroel Ramming MD   Signed by:   Yisroel Ramming MD on 10/20/2009   Method used:   Print then Give to Patient   RxID:   [id number] ISENTRESS 400 MG TABS (RALTEGRAVIR POTASSIUM) Take 1 tablet by mouth two times a day  #60 x 5   Entered and Authorized by:   Yisroel Ramming MD   Signed by:   Yisroel Ramming MD on 10/20/2009   Method used:   Print then Give to Patient   RxID:   [id number] TERAZOSIN HCL 2 MG CAPS (TERAZOSIN HCL) Take 1 tablet by mouth once a day  #30 x 5   Entered and Authorized by:   Yisroel Ramming MD   Signed by:   Yisroel Ramming MD on 10/20/2009   Method used:   Print then Give to Patient   RxID:   [id number] TRIAMTERENE-HCTZ 37.5-25 MG TABS (TRIAMTERENE-HCTZ) Take 1 tablet by mouth once a day  #30 x 5   Entered and Authorized by:   Yisroel Ramming MD   Signed by:    Yisroel Ramming MD on 10/20/2009   Method used:   Print then Give to Patient   RxID:   [id number] SMZ-TMP DS 800-160 MG  TABS (SULFAMETHOXAZOLE-TRIMETHOPRIM) 1 by mouth q m/w/f  #30 x 5   Entered and Authorized by:   Yisroel Ramming MD   Signed by:   Yisroel Ramming MD on 10/20/2009   Method used:   Print then Give to Patient   RxID:   [id number] ATENOLOL 25 MG  TABS (ATENOLOL) 1 by mouth once daily  #30 x 5   Entered and Authorized by:   Yisroel Ramming MD   Signed by:   Yisroel Ramming MD on 10/20/2009   Method used:   Print then Give to Patient   RxID:   [id number]

## 2010-09-12 NOTE — Progress Notes (Signed)
Summary: refill/mld  Phone Note Refill Request Message from:  Fax from Pharmacy on Dec 29, 2009 10:55 AM  Refills Requested: Medication #1:  TRUVADA 200-300 MG TABS Take 1 tablet by mouth once a day   Last Refilled: 11/22/2009  Method Requested: Fax to Local Pharmacy Next Appointment Scheduled: February 03, 2010 Initial call taken by: Paulo Fruit  BS,CPht II,MPH,  Dec 29, 2009 10:55 AM    Prescriptions: TRUVADA 200-300 MG TABS (EMTRICITABINE-TENOFOVIR) Take 1 tablet by mouth once a day  #30 x 5   Entered by:   Paulo Fruit  BS,CPht II,MPH   Authorized by:   Yisroel Ramming MD   Signed by:   Paulo Fruit  BS,CPht II,MPH on 12/29/2009   Method used:   Electronically to        [facility] [address] [id number]* (retail)       [address] [place], [place]  [address]       Ph: [phone]       Fax: [phone]   RxID:   [id number]  Paulo Fruit  BS,CPht II,MPH  Dec 29, 2009 10:56 AM

## 2010-09-12 NOTE — Miscellaneous (Signed)
Summary: [facility] Previsit/prep  Clinical Lists Changes  Medications: Added new medication of MOVIPREP 100 GM  SOLR (PEG-KCL-NACL-NASULF-NA ASC-C) As per prep instructions. - Signed Rx of MOVIPREP 100 GM  SOLR (PEG-KCL-NACL-NASULF-NA ASC-C) As per prep instructions.;  #1 x 0;  Signed;  Entered by: Wyona Almas RN;  Authorized by: Mardella Layman MD [facility];  Method used: Electronically to [facility] [id number] [address], [place], [place]  [address], Ph: [phone], Fax: [phone] Observations: Added new observation of NKA: T (04/19/2010 13:16)    Prescriptions: MOVIPREP 100 GM  SOLR (PEG-KCL-NACL-NASULF-NA ASC-C) As per prep instructions.  #1 x 0   Entered by:   Wyona Almas RN   Authorized by:   Mardella Layman MD [facility]   Signed by:   Wyona Almas RN on 04/19/2010   Method used:   Electronically to        [facility] [address] [id number]* (retail)       [address] [place], [place]  [address]       Ph: [phone]       Fax: [phone]   RxID:   [id number]

## 2010-09-12 NOTE — Assessment & Plan Note (Signed)
Summary: F/U/OV/VS   CC:  follow-up visit, lab results, and c/o feet swelling off and on.  History of Present Illness: Pt c/o intermittant swelling in his feet.  No SOB or chest pain. He has been taking his diuretic. No missed doses of his HIV meds.  Preventive Screening-Counseling & Management  Alcohol-Tobacco     Alcohol drinks/day: occasional     Alcohol type: wine     Smoking Status: quit  Caffeine-Diet-Exercise     Caffeine use/day: occasional     Does Patient Exercise: yes     Type of exercise: light weights     Exercise (avg: min/session): <30     Times/week: <3  Safety-Violence-Falls     Seat Belt Use: yes      Sexual History:  currently monogamous.    Comments: pt. declined condoms   Updated Prior Medication List: SMZ-TMP DS 800-160 MG  TABS (SULFAMETHOXAZOLE-TRIMETHOPRIM) 1 by mouth q m/w/f TRIAMTERENE-HCTZ 37.5-25 MG TABS (TRIAMTERENE-HCTZ) Take 1 tablet by mouth once a day TERAZOSIN HCL 2 MG CAPS (TERAZOSIN HCL) Take 1 tablet by mouth once a day ISENTRESS 400 MG TABS (RALTEGRAVIR POTASSIUM) Take 1 tablet by mouth two times a day TRUVADA 200-300 MG TABS (EMTRICITABINE-TENOFOVIR) Take 1 tablet by mouth once a day PROZAC 40 MG CAPS (FLUOXETINE HCL) Take 1 tablet by mouth once a day ATENOLOL 100 MG TABS (ATENOLOL) Take 1 tablet by mouth once a day ULTRAM 50 MG TABS (TRAMADOL HCL) Take 1 tablet by mouth every 8 hours as needed  Current Allergies (reviewed today): No known allergies  Past History:  Past Medical History: Last updated: 03/24/2007 Current Problems:  CHOLELITHIASIS (ICD-574.20) GERD (ICD-530.81) HIV INFECTION (ICD-042) INCIDNETAL ADRENAL ADENOMA NOTED U/S 12/06 Erik Frederick) 10/07 U/S DOES NOT NOTE  Social History: Sexual History:  currently monogamous  Review of Systems  The patient denies anorexia, fever, weight loss, melena, and hematochezia.    Vital Signs:  Patient profile:   55 year old male Height:      71 inches (180.34  cm) Weight:      248.0 pounds (112.73 kg) BMI:     34.71 Temp:     98.7 degrees F (37.06 degrees C) oral Pulse rate:   55 / minute BP sitting:   146 / 80  (right arm)  Vitals Entered By: [name] CMA Duncan Dull) (March 23, 2010 9:16 AM) CC: follow-up visit, lab results, c/o feet swelling off and on Is Patient Diabetic? No Pain Assessment Patient in pain? no      Nutritional Status BMI of > 30 = obese Nutritional Status Detail appetite "good"  Does patient need assistance? Functional Status Self care Ambulation Normal Comments pt. has missed 5 doses of HAART meds since last visit   Physical Exam  General:  alert, well-developed, well-nourished, and well-hydrated.   Head:  normocephalic and atraumatic.   Mouth:  pharynx pink and moist.   Lungs:  normal breath sounds.   Extremities:  no edema    Impression & Recommendations:  Problem # 1:  HIV INFECTION (ICD-042) Pt.s most recent CD4ct was 170 and VL <48.  Pt instructed to continue the current antiretroviral regimen.  Pt encouraged to take medication regularly and not miss doses.  Pt will f/u in 3 months for repeat blood work and will see me 2 weeks later. Hepatitis B vaccine #2 given.  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 1 by mouth q m/w/f  Orders: Est. Patient Level III (99213)Future Orders: T-CD4SP ([facility]) (CD4SP) ... 06/21/2010 T-HIV Viral Load [id number]) ... 06/21/2010 T-Comprehensive Metabolic Panel [phone]) ... 06/21/2010 T-CBC w/Diff ([id number]) ... 06/21/2010  Problem # 2:  PREVENTIVE HEALTH  CARE (ICD-V70.0) schedule screening colonoscopy check PSA next visit Orders: T-PSA ([id number]) Gastroenterology Referral (GI)  Other Orders: Hepatitis B Vaccine >9yrs (57846) Admin 1st Vaccine (96295)  Patient Instructions: 1)  Please schedule a follow-up appointment in 3 months, 2 weeks after labs.  Prescriptions: ATENOLOL 100 MG TABS (ATENOLOL) Take 1 tablet by mouth once a day  #30 x 5   Entered and Authorized by:   Yisroel Ramming MD   Signed by:   Yisroel Ramming MD on 03/23/2010   Method used:   Print then Give to Patient   RxID:   [id number]    Immunizations Administered:  Hepatitis B Vaccine # 2:    Vaccine Type: HepB Adult    Site: right deltoid    Mfr: Merck    Dose: 0.5 ml    Route: IM    Given by: [name] CMA ( AAMA)    Exp. Date: 12/11/2011    Lot #: [id number]    VIS given: 02/27/06 version given March 23, 2010.

## 2010-09-12 NOTE — Procedures (Signed)
Summary: Colonoscopy  Patient: Erik Frederick Note: All result statuses are Final unless otherwise noted.  Tests: (1) Colonoscopy (COL)   COL Colonoscopy           DONE (C)     [facility]     [address]     [place], [place]  [address]           COLONOSCOPY PROCEDURE REPORT           PATIENT:  [name], [name]  MR#:  [id number]     BIRTHDATE:  18-Apr-1956, 54 yrs. old  GENDER:  male     ENDOSCOPIST:  Vania Rea. Jarold Motto, MD, [facility]     REF. BY:  Yisroel Ramming, M.D.     PROCEDURE DATE:  05/03/2010     PROCEDURE:  Average-risk screening colonoscopy     G0121     ASA CLASS:  Class II     INDICATIONS:  Routine Risk Screening     MEDICATIONS:   Fentanyl 50 mcg IV, Benadryl 25 mg IV, Versed 8 mg     IV           DESCRIPTION OF PROCEDURE:   After the risks benefits and     alternatives of the procedure were thoroughly explained, informed     consent was obtained.  Digital rectal exam was performed and     revealed no abnormalities.   The LB [id number] endoscope     was introduced through the anus and advanced to the cecum, which     was identified by both the appendix and ileocecal valve, without     limitations.  The quality of the prep was good, using MoviPrep.     The instrument was then slowly withdrawn as the colon was fully     examined.     <<PROCEDUREIMAGES>>           FINDINGS:  No polyps or cancers were seen.  This was otherwise a     normal examination of the colon.   Retroflexed views in the rectum     revealed no abnormalities.    The scope was then withdrawn from     the patient and the procedure completed.           COMPLICATIONS:  None     ENDOSCOPIC IMPRESSION:     1) No polyps or cancers     2) Otherwise normal examination     RECOMMENDATIONS:     1) Continue current colorectal screening recommendations for     "routine risk" patients with a repeat colonoscopy in 10 years.     REPEAT EXAM:  No           ______________________________  Vania Rea. Jarold Motto, MD, [facility]           CC:           n.     REVISED:  05/04/2010 02:03 PM     eSIGNED:   Vania Rea. Rashawd Laskaris at 05/04/2010 02:03 PM           Davy Pique, [id number]  Note: An exclamation mark (!) indicates a result that was not dispersed into the flowsheet. Document Creation Date: 05/04/2010 2:04 PM _______________________________________________________________________  (1) Order result status: Final Collection or observation date-time: 05/03/2010 10:42 Requested date-time:  Receipt date-time:  Reported date-time:  Referring Physician:   Ordering Physician: Sheryn Bison [phone]) Specimen Source:  Source: Launa Grill Order Number: [id number] Lab site:

## 2010-09-12 NOTE — Medication Information (Signed)
Summary: RightSource:RX  RightSource:RX   Imported By: Florinda Marker 05/30/2010 16:27:32  _____________________________________________________________________  External Attachment:    Type:   Image     Comment:   External Document

## 2010-09-12 NOTE — Miscellaneous (Signed)
Summary: RW flowsheet update  Clinical Lists Changes  Observations: Added new observation of PATNTCOUNTY: [place] (03/31/2010 10:58) Added new observation of RACE: African American (03/31/2010 10:58) Added new observation of PAYOR: Medicare (03/31/2010 10:58) Added new observation of INFECTDIS MD: Philipp Deputy (03/31/2010 10:58) Added new observation of GENDER: Male (03/31/2010 10:58) Added new observation of LATINO/HISP: No (03/31/2010 10:58)

## 2010-09-12 NOTE — Miscellaneous (Signed)
Summary: clinical update/ryan white NCADAP apprv til 11/11/10  Clinical Lists Changes  Observations: Added new observation of AIDSDAP: Yes 2011 (01/10/2010 9:54)

## 2010-09-12 NOTE — Letter (Signed)
Summary: Previsit letter  [facility]  [address] [place], [place] [address]   Phone: [phone]  Fax: [phone]       03/23/2010 MRN: [id number]  Erik Frederick [address] [place], [place]  [address]  Dear [name],  Welcome to the Gastroenterology Division at [facility].    You are scheduled to see a nurse for your pre-procedure visit on 04-19-10 at 1:30p.m. on the 3rd floor at [facility], [address]  We ask that you try to arrive at our office 15 minutes prior to your appointment time to allow for check-in.  Your nurse visit will consist of discussing your medical and surgical history, your immediate family medical history, and your medications.    Please bring a complete list of all your medications or, if you prefer, bring the medication bottles and we will list them.  We will need to be aware of both prescribed and over the counter drugs.  We will need to know exact dosage information as well.  If you are on blood thinners (Coumadin, Plavix, Aggrenox, Ticlid, etc.) please call our office today/prior to your appointment, as we need to consult with your physician about holding your medication.   Please be prepared to read and sign documents such as consent forms, a financial agreement, and acknowledgement forms.  If necessary, and with your consent, a friend or relative is welcome to sit-in on the nurse visit with you.  Please bring your insurance card so that we may make a copy of it.  If your insurance requires a referral to see a specialist, please bring your referral form from your primary care physician.  No co-pay is required for this nurse visit.     If you cannot keep your appointment, please call [phone] to cancel or reschedule prior to your appointment date.  This allows [place] the opportunity to schedule an appointment for another patient in need of care.    Thank you for choosing [facility] for your medical needs.   We appreciate the opportunity to care for you.  Please visit [place] at our website  to learn more about our practice.                     Sincerely.                                                                                                                   The Gastroenterology Division

## 2010-09-14 NOTE — Miscellaneous (Signed)
  Clinical Lists Changes  Observations: Added new observation of INCOMESOURCE: UNKNOWN (09/07/2010 11:54) Added new observation of HOUSEINCOME: 0  (09/07/2010 11:54) Added new observation of #CHILD<18 IN: Unknown  (09/07/2010 11:54) Added new observation of FAMILYSIZE: 0  (09/07/2010 11:54) Added new observation of HOUSING: Unknown  (09/07/2010 11:54) Added new observation of YEARLYEXPEN: 0  (09/07/2010 11:54) Added new observation of MARITAL STAT: Unknown  (09/07/2010 11:54)

## 2010-09-20 NOTE — Assessment & Plan Note (Signed)
Summary: 55month f/u [mkj] [email or web address] @ 3:15   CC:  follow-up visit lab and pt says he has painful joints in hands and elbow.  History of Present Illness: Doing generally well.  Having increasing joint stifffness and aching pain in elbows, knees, and hands.  Worse in a.m. and improves throughout the day, except during sedentary times.  tramadol not helping with stiffness.  Adherent to meds.  Preventive Screening-Counseling & Management  Alcohol-Tobacco     Alcohol drinks/day: occasional     Alcohol type: wine     Smoking Status: quit  Caffeine-Diet-Exercise     Caffeine use/day: occasional     Does Patient Exercise: yes     Type of exercise: light weights     Exercise (avg: min/session): <30     Times/week: <3  Safety-Violence-Falls     Seat Belt Use: yes   Current Allergies (reviewed today): No known allergies  Past History:  Past medical, surgical, family and social histories (including risk factors) reviewed for relevance to current acute and chronic problems.  Past Medical History: Reviewed history from 03/24/2007 and no changes required. Current Problems:  CHOLELITHIASIS (ICD-574.20) GERD (ICD-530.81) HIV INFECTION (ICD-042) INCIDNETAL ADRENAL ADENOMA NOTED U/S 12/06 Durwin Nora [place]) 10/07 U/S DOES NOT NOTE  Past Surgical History: Reviewed history from 03/24/2007 and no changes required. Cholecystectomy 1/08  Family History: Reviewed history and no changes required.  Social History: Reviewed history and no changes required.  Review of Systems General:  Denies chills, fatigue, fever, loss of appetite, malaise, sleep disorder, sweats, weakness, and weight loss. Eyes:  Denies blurring, discharge, double vision, eye irritation, eye pain, halos, itching, light sensitivity, red eye, vision loss-1 eye, and vision loss-both eyes. ENT:  Denies decreased hearing, difficulty swallowing, ear discharge, earache, hoarseness, nasal congestion, nosebleeds, postnasal drainage,  ringing in ears, sinus pressure, and sore throat. CV:  Denies bluish discoloration of lips or nails, chest pain or discomfort, difficulty breathing at night, difficulty breathing while lying down, fainting, fatigue, leg cramps with exertion, lightheadness, near fainting, palpitations, shortness of breath with exertion, swelling of feet, swelling of hands, and weight gain. Resp:  Denies chest discomfort, chest pain with inspiration, cough, coughing up blood, excessive snoring, hypersomnolence, morning headaches, pleuritic, shortness of breath, sputum productive, and wheezing. GI:  Complains of indigestion; denies abdominal pain, bloody stools, change in bowel habits, constipation, dark tarry stools, diarrhea, excessive appetite, gas, hemorrhoids, loss of appetite, nausea, vomiting, vomiting blood, and yellowish skin color. GU:  Denies decreased libido, discharge, dysuria, genital sores, hematuria, incontinence, nocturia, urinary frequency, and urinary hesitancy. MS:  Complains of joint pain, joint swelling, and stiffness; denies joint redness, low back pain, mid back pain, muscle aches, cramps, muscle weakness, and thoracic pain. Derm:  Denies changes in color of skin, changes in nail beds, dryness, excessive perspiration, flushing, hair loss, insect bite(s), itching, lesion(s), poor wound healing, and rash. Neuro:  Denies brief paralysis, difficulty with concentration, disturbances in coordination, falling down, headaches, inability to speak, memory loss, numbness, poor balance, seizures, sensation of room spinning, tingling, tremors, visual disturbances, and weakness. Psych:  Denies alternate hallucination ( auditory/visual), anxiety, depression, easily angered, easily tearful, irritability, mental problems, panic attacks, sense of great danger, suicidal thoughts/plans, thoughts of violence, unusual visions or sounds, and thoughts /plans of harming others. Endo:  Denies cold intolerance, excessive hunger,  excessive thirst, excessive urination, heat intolerance, polyuria, and weight change.  Vital Signs:  Patient profile:   55 year old male Height:      77  inches Weight:      267 pounds BMI:     37.37 Temp:     98.1 degrees F oral Pulse rate:   56 / minute BP sitting:   143 / 82  (right arm)  Vitals Entered By: Alesia Morin CMA (September 11, 2010 2:54 PM) CC: follow-up visit lab, pt says he has painful joints in hands and elbow Is Patient Diabetic? No Pain Assessment Patient in pain? no      Nutritional Status BMI of > 30 = obese Nutritional Status Detail appetite "good"  Have you ever been in a relationship where you felt threatened, hurt or afraid?No   Does patient need assistance? Functional Status Self care Ambulation Normal Comments no missed doses of meds   Physical Exam  General:  alert, well-developed, well-hydrated, and overweight-appearing.   Head:  normocephalic, atraumatic, no abnormalities observed, and no abnormalities palpated.   Eyes:  vision grossly intact, pupils equal, pupils round, and pupils reactive to light.   Ears:  R ear normal and L ear normal.   Mouth:  good dentition, no gingival abnormalities, and pharynx pink and moist.   Neck:  No deformities, masses, or tenderness noted. Lungs:  Normal respiratory effort, chest expands symmetrically. Lungs are clear to auscultation, no crackles or wheezes. Heart:  Normal rate and regular rhythm. S1 and S2 normal without gallop, murmur, click, rub or other extra sounds. Abdomen:  Bowel sounds positive,abdomen soft and non-tender without masses, organomegaly or hernias noted. Msk:  normal ROM, no joint tenderness, no joint warmth, no redness over joints, no joint deformities, no joint instability, no crepitation, joint swelling, and enlarged MCP joints.   Pulses:  R and L carotid,radial,femoral,dorsalis pedis and posterior tibial pulses are full and equal bilaterally Extremities:  No clubbing, cyanosis, edema, or  deformity noted with normal full range of motion of all joints.   Neurologic:  No cranial nerve deficits noted. Station and gait are normal.  Sensory, motor and coordinative functions appear intact. Skin:  Intact without suspicious lesions or rashes Cervical Nodes:  No lymphadenopathy noted Axillary Nodes:  No palpable lymphadenopathy Psych:  Cognition and judgment appear intact. Alert and cooperative with normal attention span and concentration. No apparent delusions, illusions, hallucinations   Impression & Recommendations:  Problem # 1:  HIV INFECTION (ICD-042) CD4 240 @ 12% with HIV RNA <20 copies/ml per PCR.  Clinically stable.  If CD4 remains > 200 for 6 months, we will d/c SMZ-TMP.  Continue present meds for now.  To have fasting labs in 10 weeks and f/u in 3 months.  Verbally acknowledged and agreed with plan. His updated medication list for this problem includes:    Smz-tmp Ds 800-160 Mg Tabs (Sulfamethoxazole-trimethoprim) .Marland Kitchen... 1 by mouth q m/w/f  Orders: Est. Patient Level IV (99214)Future Orders: T-CBC w/Diff ([id number]) ... 11/20/2010 T-CD4SP ([facility]) (CD4SP) ... 11/20/2010 T-Comprehensive Metabolic Panel [id number]) ... 11/20/2010 T-HIV Viral Load [phone]) ... 11/20/2010 T-Chlamydia  Probe, urine [id number]) ... 11/20/2010 T-GC Probe, urine [id number]) ... 11/20/2010 T-RPR (Syphilis) [id number]) ... 11/20/2010  Problem # 2:  OSTEOARTHRITIS, GENERALIZED (ICD-715.00) Spent >20 min. discussing cause, nature, and interventions related to osteoarthritis.  Measures to improve mobility and ROM revviewed including early morning exercises of joints.  As he would like to try OTC meds first, we recommend the use of Naproxen (Alleve) as per package instructions.  If symptoms do not improve, we will try a COX2 inhibitor (Celebrex) with close observation. His updated medication list for this problem includes:  Ultram 50 Mg Tabs (Tramadol hcl) .Marland Kitchen... Take 1 tablet by  mouth every 8 hours as needed  Orders: Est. Patient Level IV ([address])  Other Orders: Flu Vaccine 6-35 months (60454) TB Skin Test [phone]) Admin 1st Vaccine (91478) Admin of Any Addtl Vaccine (29562) Hepatitis B Vaccine >33yrs (13086) Future Orders: T-Lipid Profile ([id number]) ... 11/20/2010      Immunizations Administered:  Influenza Vaccine # 1:    Vaccine Type: Fluvax 6-56mos    Site: right deltoid    Mfr: GlaxoSmithKline    Dose: 0.5 ml    Route: IM    Given by: Alesia Morin CMA    Exp. Date: 02/10/2011    Lot #: [id number]    VIS given: 03/07/10 version given September 11, 2010.  PPD Skin Test:    Vaccine Type: PPD    Site: left forearm    Mfr: Sanofi Pasteur    Dose: 0.1 ml    Route: ID    Given by: Alesia Morin CMA    Exp. Date: 02/17/2012    Lot #: [id number]  Hepatitis B Vaccine # 3:    Vaccine Type: HepB Adult    Site: left deltoid    Mfr: Merck    Dose: 1.0 ml    Route: IM    Given by: Alesia Morin CMA    Exp. Date: 05/16/2012    Lot #: [id number]    VIS given: 02/27/06 version given September 11, 2010.  Flu Vaccine Consent Questions:    Do you have a history of severe allergic reactions to this vaccine? no    Any prior history of allergic reactions to egg and/or gelatin? no    Do you have a sensitivity to the preservative Thimersol? no    Do you have a past history of Guillan-Barre Syndrome? no    Do you currently have an acute febrile illness? no    Have you ever had a severe reaction to latex? no    Vaccine information given and explained to patient? yes

## 2010-10-02 ENCOUNTER — Encounter (INDEPENDENT_AMBULATORY_CARE_PROVIDER_SITE_OTHER): Payer: Self-pay | Admitting: *Deleted

## 2010-10-10 NOTE — Miscellaneous (Signed)
  Clinical Lists Changes  Observations: Added new observation of HIV RISK BEH: Injecting Drug Use (IDU) (10/02/2010 12:03)

## 2010-10-16 ENCOUNTER — Encounter: Payer: Self-pay | Admitting: Adult Health

## 2010-10-24 NOTE — Miscellaneous (Signed)
Summary: [facility] Home Visits  [facility] Visits   Imported By: Florinda Marker 10/19/2010 09:48:10  _____________________________________________________________________  External Attachment:    Type:   Image     Comment:   External Document

## 2010-10-29 LAB — T-HELPER CELL (CD4) - (RCID CLINIC ONLY)
CD4 % Helper T Cell: 12 % — ABNORMAL LOW (ref 33–55)
CD4 T Cell Abs: 170 uL — ABNORMAL LOW (ref 400–2700)

## 2010-11-27 ENCOUNTER — Telehealth: Payer: Self-pay | Admitting: *Deleted

## 2010-11-27 NOTE — Telephone Encounter (Signed)
Having difficulty sleeping.  Requesting appt to discuss. Jennet Maduro, RN.

## 2010-11-28 ENCOUNTER — Encounter: Payer: Self-pay | Admitting: Adult Health

## 2010-11-28 ENCOUNTER — Ambulatory Visit (INDEPENDENT_AMBULATORY_CARE_PROVIDER_SITE_OTHER): Payer: Medicare Other | Admitting: Adult Health

## 2010-11-28 VITALS — BP 140/74 | HR 55 | Temp 98.1°F | Ht 70.0 in | Wt 266.0 lb

## 2010-11-28 DIAGNOSIS — Z79899 Other long term (current) drug therapy: Secondary | ICD-10-CM

## 2010-11-28 DIAGNOSIS — G4700 Insomnia, unspecified: Secondary | ICD-10-CM

## 2010-11-28 DIAGNOSIS — B20 Human immunodeficiency virus [HIV] disease: Secondary | ICD-10-CM

## 2010-11-28 DIAGNOSIS — Z113 Encounter for screening for infections with a predominantly sexual mode of transmission: Secondary | ICD-10-CM

## 2010-11-28 MED ORDER — LORAZEPAM 1 MG PO TABS: ORAL_TABLET | ORAL | Status: DC

## 2010-11-28 NOTE — Progress Notes (Signed)
Erik Frederick returns to clinic today with a chief complaint of unable to fall asleep over the last 2 weeks. He states he has had problems at home with his new granddaughter and his son. He feels these stressors have been what has been causing him to have difficulties falling asleep. He remains inferior to his HIV therapies and all other medications and he is currently taking. He denies nervousness, depressive thoughts, anxiety, or anhedonia. He was scheduled to have HIV staging labs performed on April 9 he forgot about them and said he would do them today.  Review of systems Gen.: He denies chills fever, fatigue, loss of appetite, malaise, sweats, weakness, or weight loss, sleep disorder as previously outlined in history of present illness. Eyes: Denies blurring discharge, double vision, eye irritation, or eye pain. ENT: Denies decreased hearing, difficulty swallowing, ear aches, hoarseness, nasal congestion, or sinus pressure. CV: Denies chest pain or discomfort, difficulty breathing at night, difficulty breathing while lying down thinking, fatigue, or lightheadedness. Respiratory: Denies chest discomfort, chest pain, cough, shortness of breath, or dyspnea on exertion. GI: Tab denies abdominal pain, bloody stools, change in bowel habits, constipation, or indigestion. GU: Denies decreased libido, discharge, dysuria, increased urinary frequency, incontinence, or nocturia. MS: Denies joint pain, joint redness, joint swelling, loss of strength. Low back pain, or mid back pain, muscle aches, muscle cramps, or weakness. Derm: Denies change in skin lesions or rash. Neuro: Denies brief paralysis, difficulty concentration disturbances, and coordination falling down. Headaches, memory loss, poor balance, seizures, or loss of consciousness. Psych: Denies auditory or visual hallucinations, anxiety, depression, agitation, tearfulness, irritability, panic attacks suicidal thoughts or plans.  Physical  Exam. General: Alert, well-developed, well-hydrated, obese. Head: Normocephalic, atraumatic. No abnormalities observed and no abnormalities palpated. Eyes: Vision grossly intact. Pupils equal, pupils, round, and pupils reactive to light. Ears: Right ear normal left ear normal. Nose: No external deformity and no nasal discharge. Mouth: Fair dentition. No gingival abnormalities. Some dental plaque and pharynx is pink and moist. Neck: Supple full range of motion. No masses. No thyromegaly. No thyroid nodules or tenderness noted. Lungs: Normal respiratory effort and normal breath sounds. Heart: Normal rate regular rhythm. No murmur no gallop no rub. No JVD. Abdomen: Soft, nontender, normal bowel sounds. No hepatomegaly, and no splenomegaly. MSK: Normal range of motion no joint tenderness. No joint swelling. Extremities: No clubbing, no edema. No cyanosis noted. Neurologic: No cranial nerve deficits noted. Station and gait are normal. Motor and coordinated functions appear intact. Skin: Intact without suspicious lesions or rashes. Psych: Cognition and judgment appear intact. Alert and cooperative with normal attention span and concentration. No apparent delusions, illusions, hallucinations.  Assessment and plan  1. HIV. No current labs available since January of 2012 we will repeat staging labs, as well as routine chemistry, and lipid panels today and we will ask him to continue his current intervertebral regimen until we followup with him in about 2-3 weeks.  2. Insomnia. Given the current stressors. He is experiencing. We will try him on lorazepam 1 mg 1-2 tabs by mouth Q. at bedtime as needed for sleep and reevaluate this when he returns to clinic for his followup scheduled for 2-3 weeks.

## 2010-11-28 NOTE — Progress Notes (Signed)
  Subjective:    Patient ID: Erik Frederick, male    DOB: 10/31/1955, 55 y.o.   MRN: [id number]  HPI    Review of Systems     Objective:   Physical Exam        Assessment & Plan:

## 2010-11-29 LAB — LIPID PANEL
Cholesterol: 159 mg/dL (ref 0–200)
HDL: 56 mg/dL (ref >39)
LDL Cholesterol: 86 mg/dL (ref 0–99)
Total CHOL/HDL Ratio: 2.8 Ratio
Triglycerides: 83 mg/dL (ref <150)
VLDL: 17 mg/dL (ref 0–40)

## 2010-11-29 LAB — COMPLETE METABOLIC PANEL WITH GFR
ALT: 55 U/L — ABNORMAL HIGH (ref 0–53)
AST: 49 U/L — ABNORMAL HIGH (ref 0–37)
Albumin: 4.4 g/dL (ref 3.5–5.2)
Alkaline Phosphatase: 105 U/L (ref 39–117)
BUN: 10 mg/dL (ref 6–23)
CO2: 25 mEq/L (ref 19–32)
Calcium: 9.5 mg/dL (ref 8.4–10.5)
Chloride: 104 mEq/L (ref 96–112)
Creat: 1.25 mg/dL (ref 0.40–1.50)
GFR, Est African American: >60 mL/min (ref >60)
GFR, Est Non African American: 60 mL/min — ABNORMAL LOW (ref >60)
Glucose, Bld: 94 mg/dL (ref 70–99)
Potassium: 4.4 mEq/L (ref 3.5–5.3)
Sodium: 141 mEq/L (ref 135–145)
Total Bilirubin: 0.9 mg/dL (ref 0.3–1.2)
Total Protein: 7.6 g/dL (ref 6.0–8.3)

## 2010-11-29 LAB — CBC WITH DIFFERENTIAL/PLATELET
Basophils Absolute: 0 10*3/uL (ref 0.0–0.1)
Basophils Relative: 1 % (ref 0–1)
Eosinophils Absolute: 0.1 10*3/uL (ref 0.0–0.7)
Eosinophils Relative: 2 % (ref 0–5)
HCT: 43.9 % (ref 39.0–52.0)
Hemoglobin: 15.3 g/dL (ref 13.0–17.0)
Lymphocytes Relative: 36 % (ref 12–46)
Lymphs Abs: 1.7 10*3/uL (ref 0.7–4.0)
MCH: 33.1 pg (ref 26.0–34.0)
MCHC: 34.9 g/dL (ref 30.0–36.0)
MCV: 95 fL (ref 78.0–100.0)
Monocytes Absolute: 0.4 10*3/uL (ref 0.1–1.0)
Monocytes Relative: 9 % (ref 3–12)
Neutro Abs: 2.5 10*3/uL (ref 1.7–7.7)
Neutrophils Relative %: 52 % (ref 43–77)
Platelets: 186 10*3/uL (ref 150–400)
RBC: 4.62 MIL/uL (ref 4.22–5.81)
RDW: 13.4 % (ref 11.5–15.5)
WBC: 4.8 10*3/uL (ref 4.0–10.5)

## 2010-11-29 LAB — T-HELPER CELL (CD4) - (RCID CLINIC ONLY)
CD4 % Helper T Cell: 14 % — ABNORMAL LOW (ref 33–55)
CD4 T Cell Abs: 230 uL — ABNORMAL LOW (ref 400–2700)

## 2010-11-29 LAB — GC/CHLAMYDIA PROBE AMP, URINE
Chlamydia, Swab/Urine, PCR: NEGATIVE
GC Probe Amp, Urine: NEGATIVE

## 2010-11-29 LAB — RPR

## 2010-11-30 LAB — HIV-1 RNA QUANT-NO REFLEX-BLD
HIV 1 RNA Quant: <20 copies/mL (ref <20)
HIV-1 RNA Quant, Log: <1.3 {Log} (ref <1.30)

## 2010-12-05 ENCOUNTER — Other Ambulatory Visit: Payer: Self-pay

## 2010-12-25 ENCOUNTER — Ambulatory Visit: Payer: Self-pay | Admitting: Adult Health

## 2010-12-29 NOTE — Op Note (Signed)
NAMEJONTA, GASTINEAU              ACCOUNT NO.:  [id number]   MEDICAL RECORD NO.:  [id number]          PATIENT TYPE:  AMB   LOCATION:  SDS                          FACILITY:  [facility]   PHYSICIAN:  Cherylynn Ridges, M.D.    DATE OF BIRTH:  [birth year]   DATE OF PROCEDURE:  07/17/2006  DATE OF DISCHARGE:  07/17/2006                               OPERATIVE REPORT   PREOPERATIVE DIAGNOSIS:  Symptomatic cholelithiasis.   POSTOPERATIVE DIAGNOSIS:  Symptomatic cholelithiasis with a normal  interoperative cholangiogram.   PROCEDURE:  Laparoscopic cholecystectomy with cholangiogram.   SURGEON:  Cherylynn Ridges, M.D.   ANESTHESIA:  General endotracheal.   ESTIMATED BLOOD LOSS:  Less than 50 mL.   COMPLICATIONS:  None.   CONDITION:  Stable.   FINDINGS:  The patient had hundreds of stones and a packed gallbladder  with evidence of acute cholecystitis.  Cholangiogram was normal with  good flow into the duodenum, no filling defects and no obstruction.   OPERATION:  The patient was taken to the operating room and placed on  the table in a supine position.  After an adequate endotracheal  anesthetic was administered, he was prepped and draped in the usual  sterile manner exposing the midline and the right upper quadrant.   A supraumbilical curvilinear incision was made using #11 blade and taken  down to the midline fascia.  The fascia was grasped with a Kocher clamp  x2 and then we made an incision between the two clamps using a #15  blade.  We then bluntly dissected down into the peritoneal cavity using  a Kelly clamp and then passed a pursestring suture of 0 Vicryl around  the fascial opening.  We then passed a Hassan cannula into the  peritoneal cavity while holding up on the Kocher clamps.  We secured it  in place with the pursestring suture.  We then insufflated carbon  dioxide gas up to maximal intra-abdominal pressure of 15 mmHg.   The patient was placed reversed Trendelenburg and the  left side was  tilted down.  Two right costal margin 5-mm cannulae and a subxiphoid  11/12 mm were passed under direct vision.  It was noted that the patient  had some adhesions medially along the falciform ligament of omentum and  also the duodenum being tented out towards the liver.  We took these  adhesions down sharply with electrocautery, making sure to get adequate  hemostasis.  We were then able to decompress the gallbladder using a  Nezhat aspirator showing that most of the distention in the gallbladder  was from multiple stones.  We retracted gallbladder towards the anterior  abdominal wall with a grasper through the lateral most 5-mm cannula.  A  second grasper was placed on the infundibulum.  We dissected out the  peritoneum overlying the triangle of Fallot and hepatoduodenal triangle.   The cystic duct and cystic artery were both isolated easily. We placed a  clip on the gallbladder side of the cystic duct, made a  cholecystodochotomy using laparoscopic scissors, then performed a  cholangiogram using a Cook catheter which was  passed through the  anterior wall into the cholecystodochotomy.  The cholangiogram showed  good flow into the duodenum, no proximal filling defects, no proximal  obstruction, and good proximal flow.  There was no dilatation.  We  removed the clip holding the cholangiocatheter in place, removed the  catheter, and then clipped the distal cystic duct using two Endoclips.  We then transected the cystic duct and then transected the cystic artery  after proximally and distally clipping it.  We dissected out the  gallbladder fundus bed with minimal difficulty although a tear in the  gallbladder wall allowed some fall of gallbladder stones into the field.  We were able to retrieve all of these using a laparoscopic stone  scooper.   Once the gallbladder was completely removed from its bed, we used an  EndoCatch bag to retrieve it from the supraumbilical site  where we had  opened the gallbladder and the bag in order to retrieve multiple stones  before being able to remove it from the site.  We eventually were able  to remove it after removing hundreds of stones into a saline basin.  Once this was removed, we inspected the abdomen for bleeding and also  for retained stones in the field and none were noted.  There was no  bleeding.  We irrigated with almost 2 liters of saline solution. Then we  aspirated all fluid and gas from above the liver and removed all  cannulae.   The supraumbilical cannula site was closed using the pursestring suture  which was in place which was still intact.  We closed the skin at all  sites using a running subcuticular stitch of 4-0 Vicryl except for the  lateral most 5-mm cannulae which were closed using Dermabond.  0.25%  Marcaine with epi was injected at all sites.  A sterile dressing was  applied.  Needle count,, sponge counts, and instrument counts were  correct.      Cherylynn Ridges, M.D.  Electronically Signed     JOW/MEDQ  D:  07/17/2006  T:  07/18/2006  Job:  [id number]   cc:   Dineen Kid. Reche Dixon, M.D.

## 2011-01-10 ENCOUNTER — Other Ambulatory Visit (INDEPENDENT_AMBULATORY_CARE_PROVIDER_SITE_OTHER): Payer: Medicare Other

## 2011-01-10 DIAGNOSIS — B20 Human immunodeficiency virus [HIV] disease: Secondary | ICD-10-CM

## 2011-01-11 LAB — T-HELPER CELL (CD4) - (RCID CLINIC ONLY)
CD4 % Helper T Cell: 14 % — ABNORMAL LOW (ref 33–55)
CD4 T Cell Abs: 230 uL — ABNORMAL LOW (ref 400–2700)

## 2011-01-11 LAB — HIV-1 RNA QUANT-NO REFLEX-BLD
HIV 1 RNA Quant: <20 copies/mL (ref <20)
HIV-1 RNA Quant, Log: <1.3 {Log} (ref <1.30)

## 2011-01-24 ENCOUNTER — Ambulatory Visit: Payer: Medicare Other | Admitting: Adult Health

## 2011-01-24 ENCOUNTER — Ambulatory Visit: Payer: Medicare Other | Admitting: Internal Medicine

## 2011-01-30 ENCOUNTER — Ambulatory Visit: Payer: Medicare Other | Admitting: Internal Medicine

## 2011-02-22 ENCOUNTER — Other Ambulatory Visit: Payer: Self-pay | Admitting: *Deleted

## 2011-02-22 DIAGNOSIS — B20 Human immunodeficiency virus [HIV] disease: Secondary | ICD-10-CM

## 2011-02-22 MED ORDER — RALTEGRAVIR POTASSIUM 400 MG PO TABS: 400.0000 mg | ORAL_TABLET | Freq: Two times a day (BID) | ORAL | Status: DC

## 2011-02-23 ENCOUNTER — Other Ambulatory Visit: Payer: Self-pay | Admitting: *Deleted

## 2011-02-23 ENCOUNTER — Encounter: Payer: Self-pay | Admitting: Adult Health

## 2011-02-23 ENCOUNTER — Ambulatory Visit (INDEPENDENT_AMBULATORY_CARE_PROVIDER_SITE_OTHER): Payer: Medicare Other | Admitting: Adult Health

## 2011-02-23 DIAGNOSIS — B20 Human immunodeficiency virus [HIV] disease: Secondary | ICD-10-CM

## 2011-02-23 DIAGNOSIS — G4700 Insomnia, unspecified: Secondary | ICD-10-CM | POA: Insufficient documentation

## 2011-02-23 DIAGNOSIS — Z113 Encounter for screening for infections with a predominantly sexual mode of transmission: Secondary | ICD-10-CM

## 2011-02-23 DIAGNOSIS — Z79899 Other long term (current) drug therapy: Secondary | ICD-10-CM

## 2011-02-23 MED ORDER — LORAZEPAM 1 MG PO TABS: ORAL_TABLET | ORAL | Status: DC

## 2011-02-23 NOTE — Progress Notes (Signed)
  Subjective:    Patient ID: Erik Frederick, male    DOB: [birth year], 55 y.o.   MRN: [id number]  HPI Erik Frederick comes to clinic for scheduled followup. His last visit with the clinic was in April 2012, with the most recent labs obtained in May of 2012. His otherwise, previous scheduled followups are canceled, as a result of multiple issues regarding the care of a family member. Currently, this issue has since been resolved. He reported some medication adherence issues when he was having these aforementioned problems. He endorses now adherence 100% to his medications with good tolerance and no complications. He voices no other complaints today except for ongoing, chronic insomnia, which effectively responded to lorazepam therapy.   Review of Systems  Constitutional: Negative.   HENT: Negative.   Eyes: Negative.   Respiratory: Negative.   Cardiovascular: Negative.   Gastrointestinal: Negative.   Genitourinary: Negative.   Musculoskeletal: Negative.   Skin: Negative.   Neurological: Negative.   Hematological: Negative.   Psychiatric/Behavioral: Positive for sleep disturbance.       Objective:   Physical Exam  Constitutional: He is oriented to person, place, and time. He appears well-developed and well-nourished.       Obese appearing  HENT:  Head: Normocephalic and atraumatic.  Right Ear: External ear normal.  Left Ear: External ear normal.  Nose: Nose normal.  Mouth/Throat: Oropharynx is clear and moist.  Eyes: Conjunctivae and EOM are normal. Pupils are equal, round, and reactive to light.  Neck: Normal range of motion. Neck supple.  Cardiovascular: Normal rate, regular rhythm and intact distal pulses.   Pulmonary/Chest: Effort normal and breath sounds normal.  Abdominal: Soft. Bowel sounds are normal.  Musculoskeletal: Normal range of motion.  Neurological: He is alert and oriented to person, place, and time. No cranial nerve deficit. He exhibits normal muscle tone.  Coordination normal.  Skin: Skin is warm and dry.  Psychiatric: He has a normal mood and affect. His behavior is normal. Judgment and thought content normal.          Assessment & Plan:  1. HIV. Labs obtained 01/10/2011 show a CD4 count of 240 at 14% with a viral load of less than 20 copies per mL. These were the exact same values obtained in April 2012, as well. We will continue present management. For now. Ask that he return in 6 weeks for repeat labs and followup in 2 months. He verbally acknowledged this information and agreed with plan of care.  2. Insomnia. We'll renew lorazepam at current dosing.  3. Hypertension. Seems relatively controlled at present. Continue present management. Repeat fasting lipids on next blood draw.

## 2011-02-27 ENCOUNTER — Other Ambulatory Visit: Payer: Self-pay | Admitting: *Deleted

## 2011-02-27 DIAGNOSIS — B20 Human immunodeficiency virus [HIV] disease: Secondary | ICD-10-CM

## 2011-02-27 MED ORDER — RALTEGRAVIR POTASSIUM 400 MG PO TABS: 400.0000 mg | ORAL_TABLET | Freq: Two times a day (BID) | ORAL | Status: DC

## 2011-03-01 ENCOUNTER — Other Ambulatory Visit: Payer: Self-pay | Admitting: *Deleted

## 2011-03-01 DIAGNOSIS — B20 Human immunodeficiency virus [HIV] disease: Secondary | ICD-10-CM

## 2011-03-01 MED ORDER — RALTEGRAVIR POTASSIUM 400 MG PO TABS: 400.0000 mg | ORAL_TABLET | Freq: Two times a day (BID) | ORAL | Status: DC

## 2011-03-23 ENCOUNTER — Other Ambulatory Visit: Payer: Medicare Other

## 2011-04-06 ENCOUNTER — Ambulatory Visit: Payer: Medicare Other | Admitting: Adult Health

## 2011-04-06 ENCOUNTER — Encounter: Payer: Self-pay | Admitting: Adult Health

## 2011-04-06 ENCOUNTER — Encounter: Payer: Medicare Other | Admitting: Adult Health

## 2011-04-06 ENCOUNTER — Other Ambulatory Visit: Payer: Self-pay | Admitting: Adult Health

## 2011-04-06 ENCOUNTER — Other Ambulatory Visit: Payer: Medicare Other

## 2011-04-06 DIAGNOSIS — R82998 Other abnormal findings in urine: Secondary | ICD-10-CM

## 2011-04-06 DIAGNOSIS — B20 Human immunodeficiency virus [HIV] disease: Secondary | ICD-10-CM

## 2011-04-06 DIAGNOSIS — Z0000 Encounter for general adult medical examination without abnormal findings: Secondary | ICD-10-CM

## 2011-04-06 DIAGNOSIS — Z114 Encounter for screening for human immunodeficiency virus [HIV]: Secondary | ICD-10-CM

## 2011-04-06 DIAGNOSIS — Z7721 Contact with and (suspected) exposure to potentially hazardous body fluids: Secondary | ICD-10-CM

## 2011-04-06 DIAGNOSIS — Z79899 Other long term (current) drug therapy: Secondary | ICD-10-CM

## 2011-04-06 DIAGNOSIS — A64 Unspecified sexually transmitted disease: Secondary | ICD-10-CM

## 2011-04-06 DIAGNOSIS — Z202 Contact with and (suspected) exposure to infections with a predominantly sexual mode of transmission: Secondary | ICD-10-CM

## 2011-04-06 LAB — CBC WITH DIFFERENTIAL/PLATELET
Basophils Absolute: 0 10*3/uL (ref 0.0–0.1)
Basophils Relative: 1 % (ref 0–1)
Eosinophils Absolute: 0.1 10*3/uL (ref 0.0–0.7)
Eosinophils Relative: 2 % (ref 0–5)
HCT: 45.9 % (ref 39.0–52.0)
Hemoglobin: 15.9 g/dL (ref 13.0–17.0)
Lymphocytes Relative: 42 % (ref 12–46)
Lymphs Abs: 1.6 10*3/uL (ref 0.7–4.0)
MCH: 33.3 pg (ref 26.0–34.0)
MCHC: 34.6 g/dL (ref 30.0–36.0)
MCV: 96.2 fL (ref 78.0–100.0)
Monocytes Absolute: 0.4 10*3/uL (ref 0.1–1.0)
Monocytes Relative: 10 % (ref 3–12)
Neutro Abs: 1.7 10*3/uL (ref 1.7–7.7)
Neutrophils Relative %: 45 % (ref 43–77)
Platelets: 179 10*3/uL (ref 150–400)
RBC: 4.77 MIL/uL (ref 4.22–5.81)
RDW: 14 % (ref 11.5–15.5)
WBC: 3.9 10*3/uL — ABNORMAL LOW (ref 4.0–10.5)

## 2011-04-06 LAB — T-HELPER CELL (CD4) - (RCID CLINIC ONLY)
CD4 % Helper T Cell: 13 % — ABNORMAL LOW (ref 33–55)
CD4 T Cell Abs: 210 uL — ABNORMAL LOW (ref 400–2700)

## 2011-04-06 LAB — URINALYSIS, ROUTINE W REFLEX MICROSCOPIC
Glucose, UA: NEGATIVE mg/dL
Hgb urine dipstick: NEGATIVE
Ketones, ur: NEGATIVE mg/dL
Leukocytes, UA: NEGATIVE
Nitrite: NEGATIVE
Protein, ur: NEGATIVE mg/dL
Specific Gravity, Urine: 1.022 (ref 1.005–1.030)
Urobilinogen, UA: 1 mg/dL (ref 0.0–1.0)
pH: 5.5 (ref 5.0–8.0)

## 2011-04-07 LAB — COMPLETE METABOLIC PANEL WITH GFR
ALT: 45 U/L (ref 0–53)
AST: 45 U/L — ABNORMAL HIGH (ref 0–37)
Albumin: 4.1 g/dL (ref 3.5–5.2)
Alkaline Phosphatase: 107 U/L (ref 39–117)
BUN: 10 mg/dL (ref 6–23)
CO2: 25 mEq/L (ref 19–32)
Calcium: 9.3 mg/dL (ref 8.4–10.5)
Chloride: 105 mEq/L (ref 96–112)
Creat: 1.19 mg/dL (ref 0.50–1.35)
GFR, Est African American: >60 mL/min (ref >60)
GFR, Est Non African American: >60 mL/min (ref >60)
Glucose, Bld: 98 mg/dL (ref 70–99)
Potassium: 3.8 mEq/L (ref 3.5–5.3)
Sodium: 140 mEq/L (ref 135–145)
Total Bilirubin: 1.1 mg/dL (ref 0.3–1.2)
Total Protein: 7.4 g/dL (ref 6.0–8.3)

## 2011-04-07 LAB — LIPID PANEL
Cholesterol: 159 mg/dL (ref 0–200)
HDL: 58 mg/dL (ref >39)
LDL Cholesterol: 88 mg/dL (ref 0–99)
Total CHOL/HDL Ratio: 2.7 Ratio
Triglycerides: 66 mg/dL (ref <150)
VLDL: 13 mg/dL (ref 0–40)

## 2011-04-07 LAB — RPR

## 2011-04-07 LAB — GC/CHLAMYDIA PROBE AMP, URINE
Chlamydia, Swab/Urine, PCR: NEGATIVE
GC Probe Amp, Urine: NEGATIVE

## 2011-04-09 LAB — HIV-1 RNA QUANT-NO REFLEX-BLD
HIV 1 RNA Quant: <20 copies/mL (ref <20)
HIV-1 RNA Quant, Log: <1.3 {Log} (ref <1.30)

## 2011-04-20 ENCOUNTER — Ambulatory Visit (INDEPENDENT_AMBULATORY_CARE_PROVIDER_SITE_OTHER): Payer: Medicare Other | Admitting: Adult Health

## 2011-04-20 ENCOUNTER — Encounter: Payer: Self-pay | Admitting: Adult Health

## 2011-04-20 VITALS — BP 133/82 | HR 50 | Temp 98.0°F | Ht 71.0 in | Wt 253.1 lb

## 2011-04-20 DIAGNOSIS — Z0000 Encounter for general adult medical examination without abnormal findings: Secondary | ICD-10-CM

## 2011-04-20 DIAGNOSIS — Z23 Encounter for immunization: Secondary | ICD-10-CM

## 2011-04-20 DIAGNOSIS — B20 Human immunodeficiency virus [HIV] disease: Secondary | ICD-10-CM

## 2011-04-20 NOTE — Progress Notes (Signed)
  Subjective:    Patient ID: Erik Frederick, male    DOB: [birth year], 55 y.o.   MRN: [id number]  HPI Erik Frederick presents to clinic today for routine scheduled followup. He endorses adherence to his antiretroviral medications with good tolerance and no complications. Relates recent death of his father in 04-06-2011. Since that time, he has been having difficulty with organizing thoughts and plans of activity as previously, he spent most of his time caring for his father and now, that his father has passed, he states he doesn't know what to do. Denies any increase in depressive feelings or significant mood alterations. He denies, sleep pattern disturbances, but he does use lorazepam at bedtime. Denies any suicidal thoughts. Does state he has a intermittent feeling of anger or frustration with not being able to coordinate what he needs to do.   Review of Systems  Constitutional: Negative.   HENT: Negative.   Eyes: Negative.   Respiratory: Negative.   Cardiovascular: Negative.   Gastrointestinal: Negative.   Genitourinary: Negative.   Musculoskeletal: Negative.   Skin: Negative.   Neurological: Negative.   Hematological: Negative.   Psychiatric/Behavioral: Positive for decreased concentration and agitation. Negative for suicidal ideas, hallucinations, behavioral problems, confusion, sleep disturbance, self-injury and dysphoric mood. The patient is not nervous/anxious and is not hyperactive.        Objective:   Physical Exam  Constitutional: He is oriented to person, place, and time. He appears well-developed.       Overweight appearing  HENT:  Head: Normocephalic and atraumatic.  Mouth/Throat: Oropharynx is clear and moist.  Eyes: Conjunctivae and EOM are normal. Pupils are equal, round, and reactive to light.  Neck: Normal range of motion. Neck supple.  Cardiovascular: Normal rate and regular rhythm.   Pulmonary/Chest: Effort normal and breath sounds normal.  Abdominal: Soft. Bowel  sounds are normal.  Musculoskeletal: Normal range of motion.  Neurological: He is alert and oriented to person, place, and time. He has normal reflexes.  Skin: Skin is warm and dry.  Psychiatric: He has a normal mood and affect. His behavior is normal. Judgment and thought content normal.          Assessment & Plan:  1. HIV. Labs obtained 04/06/2011. Show a CD4 count of 210 at 13% with a viral load of less than 20 copies per mL. Remains clinically stable on current regimen. Recommend continuing present management, returning in 10 weeks for repeat labs, and following up in 3 months.  2. Grief Reaction. Based on both a physical assessment and glistening to him. It appears that he is having standard grief reaction to the loss of his father for whom he has cared for considerable period of time. No accelerated depressive thoughts or behaviors appear to be noted. However, given the degree of relationship, she had with his father, and the fact that there are symptoms that implies loss, we referred him to [facility] for counseling. Dose adjustments of his antidepressant therapy at this time. Will be held unless Ms. Wyline Mood feels it might help him.  He verbally acknowledged all information that was provided to him and agreed with plan of care.

## 2011-05-31 ENCOUNTER — Telehealth: Payer: Self-pay | Admitting: *Deleted

## 2011-05-31 NOTE — Telephone Encounter (Signed)
He called & wanted to speak with Alisa. Told him he was not here today & gave him her number so he can leave a message. Denied any problems at this time. Just wanted to make an appt

## 2011-06-11 ENCOUNTER — Other Ambulatory Visit: Payer: Self-pay | Admitting: *Deleted

## 2011-06-11 DIAGNOSIS — G4700 Insomnia, unspecified: Secondary | ICD-10-CM

## 2011-06-11 DIAGNOSIS — F32A Depression, unspecified: Secondary | ICD-10-CM

## 2011-06-11 DIAGNOSIS — F329 Major depressive disorder, single episode, unspecified: Secondary | ICD-10-CM

## 2011-06-11 DIAGNOSIS — B20 Human immunodeficiency virus [HIV] disease: Secondary | ICD-10-CM

## 2011-06-11 DIAGNOSIS — I10 Essential (primary) hypertension: Secondary | ICD-10-CM

## 2011-06-11 MED ORDER — EMTRICITABINE-TENOFOVIR DF 200-300 MG PO TABS: 1.0000 | ORAL_TABLET | Freq: Every day | ORAL | Status: DC

## 2011-06-11 MED ORDER — FLUOXETINE HCL 40 MG PO CAPS: 40.0000 mg | ORAL_CAPSULE | Freq: Every day | ORAL | Status: DC

## 2011-06-11 MED ORDER — TRIAMTERENE-HCTZ 37.5-25 MG PO CAPS: 1.0000 | ORAL_CAPSULE | Freq: Every day | ORAL | Status: DC

## 2011-06-12 ENCOUNTER — Other Ambulatory Visit: Payer: Self-pay | Admitting: *Deleted

## 2011-06-12 DIAGNOSIS — G4700 Insomnia, unspecified: Secondary | ICD-10-CM

## 2011-06-12 DIAGNOSIS — B20 Human immunodeficiency virus [HIV] disease: Secondary | ICD-10-CM

## 2011-06-12 DIAGNOSIS — F32A Depression, unspecified: Secondary | ICD-10-CM

## 2011-06-12 DIAGNOSIS — I10 Essential (primary) hypertension: Secondary | ICD-10-CM

## 2011-06-12 DIAGNOSIS — F329 Major depressive disorder, single episode, unspecified: Secondary | ICD-10-CM

## 2011-06-12 MED ORDER — SULFAMETHOXAZOLE-TMP DS 800-160 MG PO TABS: 1.0000 | ORAL_TABLET | ORAL | Status: DC

## 2011-06-12 MED ORDER — ATENOLOL 100 MG PO TABS: 100.0000 mg | ORAL_TABLET | Freq: Every day | ORAL | Status: DC

## 2011-06-12 MED ORDER — LORAZEPAM 1 MG PO TABS: ORAL_TABLET | ORAL | Status: DC

## 2011-06-12 MED ORDER — RALTEGRAVIR POTASSIUM 400 MG PO TABS: 400.0000 mg | ORAL_TABLET | Freq: Two times a day (BID) | ORAL | Status: DC

## 2011-06-18 ENCOUNTER — Other Ambulatory Visit: Payer: Self-pay | Admitting: *Deleted

## 2011-06-18 DIAGNOSIS — F32A Depression, unspecified: Secondary | ICD-10-CM

## 2011-06-18 DIAGNOSIS — B20 Human immunodeficiency virus [HIV] disease: Secondary | ICD-10-CM

## 2011-06-18 DIAGNOSIS — F329 Major depressive disorder, single episode, unspecified: Secondary | ICD-10-CM

## 2011-06-18 DIAGNOSIS — I10 Essential (primary) hypertension: Secondary | ICD-10-CM

## 2011-06-18 MED ORDER — EMTRICITABINE-TENOFOVIR DF 200-300 MG PO TABS: 1.0000 | ORAL_TABLET | Freq: Every day | ORAL | Status: DC

## 2011-06-18 MED ORDER — RALTEGRAVIR POTASSIUM 400 MG PO TABS: 400.0000 mg | ORAL_TABLET | Freq: Two times a day (BID) | ORAL | Status: DC

## 2011-06-18 MED ORDER — TRIAMTERENE-HCTZ 37.5-25 MG PO CAPS: 1.0000 | ORAL_CAPSULE | Freq: Every day | ORAL | Status: DC

## 2011-06-18 MED ORDER — FLUOXETINE HCL 40 MG PO CAPS: 40.0000 mg | ORAL_CAPSULE | Freq: Every day | ORAL | Status: DC

## 2011-06-18 MED ORDER — SULFAMETHOXAZOLE-TMP DS 800-160 MG PO TABS: 1.0000 | ORAL_TABLET | ORAL | Status: DC

## 2011-06-19 ENCOUNTER — Other Ambulatory Visit: Payer: Self-pay | Admitting: Licensed Clinical Social Worker

## 2011-06-19 DIAGNOSIS — F329 Major depressive disorder, single episode, unspecified: Secondary | ICD-10-CM

## 2011-06-19 DIAGNOSIS — F32A Depression, unspecified: Secondary | ICD-10-CM

## 2011-06-19 DIAGNOSIS — B20 Human immunodeficiency virus [HIV] disease: Secondary | ICD-10-CM

## 2011-06-19 DIAGNOSIS — I10 Essential (primary) hypertension: Secondary | ICD-10-CM

## 2011-06-19 MED ORDER — TRIAMTERENE-HCTZ 37.5-25 MG PO CAPS: 1.0000 | ORAL_CAPSULE | Freq: Every day | ORAL | Status: DC

## 2011-06-19 MED ORDER — EMTRICITABINE-TENOFOVIR DF 200-300 MG PO TABS: 1.0000 | ORAL_TABLET | Freq: Every day | ORAL | Status: DC

## 2011-06-19 MED ORDER — FLUOXETINE HCL 40 MG PO CAPS: 40.0000 mg | ORAL_CAPSULE | Freq: Every day | ORAL | Status: DC

## 2011-06-26 ENCOUNTER — Other Ambulatory Visit: Payer: Self-pay | Admitting: *Deleted

## 2011-06-26 DIAGNOSIS — I10 Essential (primary) hypertension: Secondary | ICD-10-CM

## 2011-06-26 MED ORDER — TRIAMTERENE-HCTZ 37.5-25 MG PO CAPS: 1.0000 | ORAL_CAPSULE | Freq: Every day | ORAL | Status: DC

## 2011-07-02 ENCOUNTER — Other Ambulatory Visit: Payer: Self-pay | Admitting: Internal Medicine

## 2011-07-02 DIAGNOSIS — B20 Human immunodeficiency virus [HIV] disease: Secondary | ICD-10-CM

## 2011-07-03 ENCOUNTER — Other Ambulatory Visit: Payer: Self-pay | Admitting: Infectious Diseases

## 2011-07-03 ENCOUNTER — Other Ambulatory Visit (INDEPENDENT_AMBULATORY_CARE_PROVIDER_SITE_OTHER): Payer: Medicare Other

## 2011-07-03 DIAGNOSIS — B20 Human immunodeficiency virus [HIV] disease: Secondary | ICD-10-CM

## 2011-07-04 ENCOUNTER — Other Ambulatory Visit: Payer: Medicare Other

## 2011-07-04 LAB — T-HELPER CELL (CD4) - (RCID CLINIC ONLY)
CD4 % Helper T Cell: 15 % — ABNORMAL LOW (ref 33–55)
CD4 T Cell Abs: 270 uL — ABNORMAL LOW (ref 400–2700)

## 2011-07-04 LAB — CBC WITH DIFFERENTIAL/PLATELET
Basophils Absolute: 0 10*3/uL (ref 0.0–0.1)
Basophils Relative: 1 % (ref 0–1)
Eosinophils Absolute: 0.1 10*3/uL (ref 0.0–0.7)
Eosinophils Relative: 1 % (ref 0–5)
HCT: 43.1 % (ref 39.0–52.0)
Hemoglobin: 14.7 g/dL (ref 13.0–17.0)
Lymphocytes Relative: 42 % (ref 12–46)
Lymphs Abs: 1.8 10*3/uL (ref 0.7–4.0)
MCH: 33.1 pg (ref 26.0–34.0)
MCHC: 34.1 g/dL (ref 30.0–36.0)
MCV: 97.1 fL (ref 78.0–100.0)
Monocytes Absolute: 0.4 10*3/uL (ref 0.1–1.0)
Monocytes Relative: 8 % (ref 3–12)
Neutro Abs: 2.1 10*3/uL (ref 1.7–7.7)
Neutrophils Relative %: 48 % (ref 43–77)
Platelets: 185 10*3/uL (ref 150–400)
RBC: 4.44 MIL/uL (ref 4.22–5.81)
RDW: 12.9 % (ref 11.5–15.5)
WBC: 4.3 10*3/uL (ref 4.0–10.5)

## 2011-07-04 LAB — COMPLETE METABOLIC PANEL WITH GFR
ALT: 41 U/L (ref 0–53)
AST: 39 U/L — ABNORMAL HIGH (ref 0–37)
Albumin: 4 g/dL (ref 3.5–5.2)
Alkaline Phosphatase: 95 U/L (ref 39–117)
BUN: 11 mg/dL (ref 6–23)
CO2: 27 mEq/L (ref 19–32)
Calcium: 8.8 mg/dL (ref 8.4–10.5)
Chloride: 106 mEq/L (ref 96–112)
Creat: 1.17 mg/dL (ref 0.50–1.35)
GFR, Est African American: 80 mL/min
GFR, Est Non African American: 69 mL/min
Glucose, Bld: 84 mg/dL (ref 70–99)
Potassium: 4 mEq/L (ref 3.5–5.3)
Sodium: 140 mEq/L (ref 135–145)
Total Bilirubin: 0.9 mg/dL (ref 0.3–1.2)
Total Protein: 6.9 g/dL (ref 6.0–8.3)

## 2011-07-04 LAB — HIV-1 RNA QUANT-NO REFLEX-BLD: HIV 1 RNA Quant: NOT DETECTED copies/mL (ref <20)

## 2011-07-19 ENCOUNTER — Ambulatory Visit: Payer: Medicare Other | Admitting: Internal Medicine

## 2011-08-15 ENCOUNTER — Other Ambulatory Visit: Payer: Self-pay | Admitting: Internal Medicine

## 2011-08-15 NOTE — Telephone Encounter (Signed)
Patient is followed at the [facility] but apparently not in our clinic; please forward the request to their refill staff.

## 2011-08-23 ENCOUNTER — Encounter: Payer: Self-pay | Admitting: Internal Medicine

## 2011-08-23 ENCOUNTER — Ambulatory Visit (INDEPENDENT_AMBULATORY_CARE_PROVIDER_SITE_OTHER): Payer: Medicare Other | Admitting: Internal Medicine

## 2011-08-23 VITALS — BP 125/75 | HR 54 | Temp 98.0°F | Wt 254.0 lb

## 2011-08-23 DIAGNOSIS — Z23 Encounter for immunization: Secondary | ICD-10-CM

## 2011-08-23 DIAGNOSIS — B20 Human immunodeficiency virus [HIV] disease: Secondary | ICD-10-CM

## 2011-08-23 DIAGNOSIS — Z21 Asymptomatic human immunodeficiency virus [HIV] infection status: Secondary | ICD-10-CM

## 2011-08-23 MED ORDER — TERAZOSIN HCL 2 MG PO CAPS: 2.0000 mg | ORAL_CAPSULE | Freq: Every day | ORAL | Status: DC

## 2011-08-23 NOTE — Assessment & Plan Note (Signed)
Despite his occasional missed doses, he continues to do well. I have emphasized at length the need to be much better and he voiced his understanding.  I discussed resistance with him and how it eliminates options.  He does feel he can do better and it does seem that his missed doses are actually less often than 1/week.  Despite that, I have told him to stop his Bactrim with a CD4 > 200 for more than 6 months.  He will return in 3 months time to see his primary provider.

## 2011-08-23 NOTE — Progress Notes (Signed)
  Subjective:    Patient ID: Erik Frederick, male    DOB: [birth year], 56 y.o.   MRN: [id number]  HPI here for routine follow up.  He has a history of poor compliance and in fact today reports occasional missed doses, his usual problem, but otherwise has no complaints.  He tells me he has always missed doses and has tried many tricks and nothing has worked.  He does understand the risk of resistance with missed doses.  Despite this, his viral load remains undetectable and his CD4 count has risen to 270.  He has had a CD4 > 200 now for over 6 months, though has not climbed higher than it is now.  He denies any recent STIs, OIs and continues to feel well.      Review of Systems  Constitutional: Negative for fever, chills, fatigue and unexpected weight change.  HENT: Negative for sore throat and trouble swallowing.   Respiratory: Negative for cough and shortness of breath.   Cardiovascular: Negative for chest pain and leg swelling.  Gastrointestinal: Negative for nausea, abdominal pain and diarrhea.  Genitourinary: Negative for discharge and genital sores.  Musculoskeletal: Negative for myalgias and arthralgias.  Skin: Negative for rash.  Neurological: Negative for light-headedness and headaches.  Hematological: Negative for adenopathy.  Psychiatric/Behavioral: Negative for dysphoric mood. The patient is not nervous/anxious.        Objective:   Physical Exam  Constitutional: He appears well-developed and well-nourished. No distress.  HENT:  Mouth/Throat: Oropharynx is clear and moist. No oropharyngeal exudate.  Cardiovascular: Normal rate, regular rhythm and normal heart sounds.  Exam reveals no gallop and no friction rub.   No murmur heard. Pulmonary/Chest: Effort normal and breath sounds normal. No respiratory distress. He has no wheezes.  Abdominal: Soft. Bowel sounds are normal. He exhibits no distension. There is no tenderness.  Lymphadenopathy:    He has no cervical adenopathy.    Neurological: He is alert.  Skin: Skin is warm and dry. No rash noted.  Psychiatric: He has a normal mood and affect. His behavior is normal.          Assessment & Plan:

## 2011-08-23 NOTE — Patient Instructions (Signed)
Don't miss your doses!

## 2011-09-18 NOTE — Progress Notes (Signed)
This encounter was created in error - please disregard.

## 2011-11-08 ENCOUNTER — Other Ambulatory Visit: Payer: Medicare Other

## 2011-11-14 ENCOUNTER — Other Ambulatory Visit: Payer: Medicare Other

## 2011-11-14 DIAGNOSIS — B20 Human immunodeficiency virus [HIV] disease: Secondary | ICD-10-CM

## 2011-11-15 LAB — CBC WITH DIFFERENTIAL/PLATELET
Basophils Absolute: 0 10*3/uL (ref 0.0–0.1)
Basophils Relative: 1 % (ref 0–1)
Eosinophils Absolute: 0 10*3/uL (ref 0.0–0.7)
Eosinophils Relative: 1 % (ref 0–5)
HCT: 42.1 % (ref 39.0–52.0)
Hemoglobin: 14.9 g/dL (ref 13.0–17.0)
Lymphocytes Relative: 38 % (ref 12–46)
Lymphs Abs: 1.2 10*3/uL (ref 0.7–4.0)
MCH: 34 pg (ref 26.0–34.0)
MCHC: 35.4 g/dL (ref 30.0–36.0)
MCV: 96.1 fL (ref 78.0–100.0)
Monocytes Absolute: 0.3 10*3/uL (ref 0.1–1.0)
Monocytes Relative: 9 % (ref 3–12)
Neutro Abs: 1.6 10*3/uL — ABNORMAL LOW (ref 1.7–7.7)
Neutrophils Relative %: 51 % (ref 43–77)
Platelets: 196 10*3/uL (ref 150–400)
RBC: 4.38 MIL/uL (ref 4.22–5.81)
RDW: 12.8 % (ref 11.5–15.5)
WBC: 3.2 10*3/uL — ABNORMAL LOW (ref 4.0–10.5)

## 2011-11-15 LAB — COMPLETE METABOLIC PANEL WITH GFR
ALT: 45 U/L (ref 0–53)
AST: 50 U/L — ABNORMAL HIGH (ref 0–37)
Albumin: 4.2 g/dL (ref 3.5–5.2)
Alkaline Phosphatase: 101 U/L (ref 39–117)
BUN: 9 mg/dL (ref 6–23)
CO2: 26 mEq/L (ref 19–32)
Calcium: 9.2 mg/dL (ref 8.4–10.5)
Chloride: 105 mEq/L (ref 96–112)
Creat: 1.19 mg/dL (ref 0.50–1.35)
GFR, Est African American: 78 mL/min
GFR, Est Non African American: 67 mL/min
Glucose, Bld: 100 mg/dL — ABNORMAL HIGH (ref 70–99)
Potassium: 3.8 mEq/L (ref 3.5–5.3)
Sodium: 139 mEq/L (ref 135–145)
Total Bilirubin: 1 mg/dL (ref 0.3–1.2)
Total Protein: 7.4 g/dL (ref 6.0–8.3)

## 2011-11-15 LAB — T-HELPER CELL (CD4) - (RCID CLINIC ONLY)
CD4 % Helper T Cell: 17 % — ABNORMAL LOW (ref 33–55)
CD4 T Cell Abs: 210 uL — ABNORMAL LOW (ref 400–2700)

## 2011-11-16 LAB — HIV-1 RNA QUANT-NO REFLEX-BLD
HIV 1 RNA Quant: <20 copies/mL (ref <20)
HIV-1 RNA Quant, Log: <1.3 {Log} (ref <1.30)

## 2011-11-22 ENCOUNTER — Ambulatory Visit: Payer: Medicare Other | Admitting: Internal Medicine

## 2011-11-29 ENCOUNTER — Ambulatory Visit: Payer: Medicare Other | Admitting: Internal Medicine

## 2011-12-03 ENCOUNTER — Ambulatory Visit (INDEPENDENT_AMBULATORY_CARE_PROVIDER_SITE_OTHER): Payer: Medicare Other | Admitting: Internal Medicine

## 2011-12-03 ENCOUNTER — Encounter: Payer: Self-pay | Admitting: Internal Medicine

## 2011-12-03 VITALS — BP 164/97 | HR 48 | Temp 97.0°F | Ht 70.0 in | Wt 247.0 lb

## 2011-12-03 DIAGNOSIS — B20 Human immunodeficiency virus [HIV] disease: Secondary | ICD-10-CM

## 2011-12-03 DIAGNOSIS — Z79899 Other long term (current) drug therapy: Secondary | ICD-10-CM

## 2011-12-03 DIAGNOSIS — Z113 Encounter for screening for infections with a predominantly sexual mode of transmission: Secondary | ICD-10-CM

## 2011-12-03 NOTE — Assessment & Plan Note (Signed)
Encouraged complete compliance.  Has had poor immune reconstitution despite remaining undetectable.  Possibly due to occasional missed doses.  Reminded him of the risk of resistance.  Condoms offered.  rtc 3 months due to compliance.

## 2011-12-03 NOTE — Progress Notes (Signed)
  Subjective:    Patient ID: Erik Frederick, male    DOB: 02/02/1956, 56 y.o.   MRN: [id number]  HPI Here for follow up of HIV.  Taking same regimen and unfortunately continues to struggle with complete compliance.  Remains undetectable but CD4 remains in the 200s.  No new issues.  Starting to exercise.     Review of Systems  Constitutional: Negative for fever, appetite change, fatigue and unexpected weight change.  HENT: Negative for sore throat and trouble swallowing.   Respiratory: Negative for cough and shortness of breath.   Cardiovascular: Negative for chest pain, palpitations and leg swelling.  Gastrointestinal: Negative for nausea, abdominal pain and diarrhea.  Genitourinary: Negative for testicular pain.  Musculoskeletal: Negative for myalgias, joint swelling and arthralgias.  Skin: Negative for rash.  Hematological: Negative for adenopathy.  Psychiatric/Behavioral: Negative for dysphoric mood. The patient is not nervous/anxious.        Objective:   Physical Exam  Constitutional: He appears well-developed and well-nourished. No distress.  HENT:  Mouth/Throat: Oropharynx is clear and moist. No oropharyngeal exudate.  Cardiovascular: Normal rate, regular rhythm and normal heart sounds.  Exam reveals no gallop and no friction rub.   No murmur heard. Pulmonary/Chest: Effort normal and breath sounds normal. No respiratory distress. He has no wheezes. He has no rales.  Abdominal: Soft. Bowel sounds are normal. He exhibits no distension. There is no tenderness. There is no rebound.  Lymphadenopathy:    He has no cervical adenopathy.  Skin: No rash noted.          Assessment & Plan:

## 2011-12-03 NOTE — Patient Instructions (Signed)
Exercise

## 2012-02-19 ENCOUNTER — Other Ambulatory Visit (HOSPITAL_COMMUNITY)
Admission: RE | Admit: 2012-02-19 | Discharge: 2012-02-19 | Disposition: A | Discharge status: Home / Self Care | Payer: Medicare Other | Source: Ambulatory Visit | Attending: Internal Medicine | Admitting: Internal Medicine

## 2012-02-19 ENCOUNTER — Other Ambulatory Visit: Payer: Medicare Other

## 2012-02-19 DIAGNOSIS — Z113 Encounter for screening for infections with a predominantly sexual mode of transmission: Secondary | ICD-10-CM | POA: Insufficient documentation

## 2012-02-19 DIAGNOSIS — B20 Human immunodeficiency virus [HIV] disease: Secondary | ICD-10-CM

## 2012-02-19 DIAGNOSIS — Z79899 Other long term (current) drug therapy: Secondary | ICD-10-CM

## 2012-02-19 LAB — CBC WITH DIFFERENTIAL/PLATELET
Basophils Absolute: 0 10*3/uL (ref 0.0–0.1)
Basophils Relative: 0 % (ref 0–1)
Eosinophils Absolute: 0 10*3/uL (ref 0.0–0.7)
Eosinophils Relative: 1 % (ref 0–5)
HCT: 41.6 % (ref 39.0–52.0)
Hemoglobin: 15.1 g/dL (ref 13.0–17.0)
Lymphocytes Relative: 42 % (ref 12–46)
Lymphs Abs: 1.3 10*3/uL (ref 0.7–4.0)
MCH: 33.7 pg (ref 26.0–34.0)
MCHC: 36.3 g/dL — ABNORMAL HIGH (ref 30.0–36.0)
MCV: 92.9 fL (ref 78.0–100.0)
Monocytes Absolute: 0.3 10*3/uL (ref 0.1–1.0)
Monocytes Relative: 11 % (ref 3–12)
Neutro Abs: 1.4 10*3/uL — ABNORMAL LOW (ref 1.7–7.7)
Neutrophils Relative %: 46 % (ref 43–77)
Platelets: 184 10*3/uL (ref 150–400)
RBC: 4.48 MIL/uL (ref 4.22–5.81)
RDW: 14.1 % (ref 11.5–15.5)
WBC: 3.1 10*3/uL — ABNORMAL LOW (ref 4.0–10.5)

## 2012-02-19 LAB — COMPREHENSIVE METABOLIC PANEL
ALT: 49 U/L (ref 0–53)
AST: 48 U/L — ABNORMAL HIGH (ref 0–37)
Albumin: 4.1 g/dL (ref 3.5–5.2)
Alkaline Phosphatase: 112 U/L (ref 39–117)
BUN: 11 mg/dL (ref 6–23)
CO2: 27 mEq/L (ref 19–32)
Calcium: 9.3 mg/dL (ref 8.4–10.5)
Chloride: 104 mEq/L (ref 96–112)
Creat: 1.13 mg/dL (ref 0.50–1.35)
Glucose, Bld: 93 mg/dL (ref 70–99)
Potassium: 3.9 mEq/L (ref 3.5–5.3)
Sodium: 139 mEq/L (ref 135–145)
Total Bilirubin: 0.7 mg/dL (ref 0.3–1.2)
Total Protein: 7.2 g/dL (ref 6.0–8.3)

## 2012-02-19 LAB — LIPID PANEL
Cholesterol: 169 mg/dL (ref 0–200)
HDL: 58 mg/dL (ref >39)
LDL Cholesterol: 88 mg/dL (ref 0–99)
Total CHOL/HDL Ratio: 2.9 Ratio
Triglycerides: 116 mg/dL (ref <150)
VLDL: 23 mg/dL (ref 0–40)

## 2012-02-20 LAB — T-HELPER CELL (CD4) - (RCID CLINIC ONLY)
CD4 % Helper T Cell: 17 % — ABNORMAL LOW (ref 33–55)
CD4 T Cell Abs: 210 uL — ABNORMAL LOW (ref 400–2700)

## 2012-02-20 LAB — HIV-1 RNA QUANT-NO REFLEX-BLD
HIV 1 RNA Quant: <20 copies/mL (ref <20)
HIV-1 RNA Quant, Log: <1.3 {Log} (ref <1.30)

## 2012-02-20 LAB — RPR

## 2012-03-04 ENCOUNTER — Ambulatory Visit: Payer: Medicare Other | Admitting: Internal Medicine

## 2012-03-12 ENCOUNTER — Ambulatory Visit (INDEPENDENT_AMBULATORY_CARE_PROVIDER_SITE_OTHER): Payer: Medicare Other | Admitting: Internal Medicine

## 2012-03-12 ENCOUNTER — Encounter: Payer: Self-pay | Admitting: Internal Medicine

## 2012-03-12 VITALS — BP 131/74 | HR 57 | Temp 98.2°F | Ht 70.5 in | Wt 252.0 lb

## 2012-03-12 DIAGNOSIS — F329 Major depressive disorder, single episode, unspecified: Secondary | ICD-10-CM

## 2012-03-12 DIAGNOSIS — B20 Human immunodeficiency virus [HIV] disease: Secondary | ICD-10-CM

## 2012-03-12 DIAGNOSIS — F32A Depression, unspecified: Secondary | ICD-10-CM

## 2012-03-12 DIAGNOSIS — F3289 Other specified depressive episodes: Secondary | ICD-10-CM

## 2012-03-12 MED ORDER — FLUOXETINE HCL 40 MG PO CAPS: 40.0000 mg | ORAL_CAPSULE | Freq: Every day | ORAL | Status: DC

## 2012-03-12 MED ORDER — RITONAVIR 100 MG PO TABS: 100.0000 mg | ORAL_TABLET | Freq: Every day | ORAL | Status: DC

## 2012-03-12 MED ORDER — DARUNAVIR ETHANOLATE 800 MG PO TABS: 800.0000 mg | ORAL_TABLET | Freq: Every day | ORAL | Status: DC

## 2012-03-12 NOTE — Progress Notes (Signed)
  Subjective:    Patient ID: Erik Frederick, male    DOB: 08/12/1956, 56 y.o.   MRN: [id number]  HPI He comes in here for followup of his HIV. He is doing well and has no complaints. Unfortunately his CD4 count remains in the 200s in this is despite his viral load been undetectable. He does endorse continued missed doses. He does have a baseline viral load in the 100s. He has had no recent hospitalizations or other new issues.   Review of Systems  Constitutional: Negative for fever, appetite change and fatigue.  HENT: Negative for sore throat and trouble swallowing.   Respiratory: Negative for cough, shortness of breath and wheezing.   Cardiovascular: Negative for chest pain, palpitations and leg swelling.  Gastrointestinal: Negative for nausea, abdominal pain and diarrhea.  Musculoskeletal: Negative for myalgias, joint swelling and arthralgias.  Skin: Negative for rash.  Hematological: Negative for adenopathy.       Objective:   Physical Exam  Constitutional: He appears well-developed and well-nourished. No distress.  HENT:  Mouth/Throat: Oropharynx is clear and moist. No oropharyngeal exudate.  Cardiovascular: Normal rate, regular rhythm and normal heart sounds.  Exam reveals no gallop and no friction rub.   No murmur heard. Pulmonary/Chest: Effort normal and breath sounds normal. No respiratory distress. He has no wheezes. He has no rales.  Abdominal: Soft. Bowel sounds are normal. He exhibits no distension. There is no tenderness.  Lymphadenopathy:    He has no cervical adenopathy.          Assessment & Plan:

## 2012-03-12 NOTE — Assessment & Plan Note (Signed)
I am concerned with his continued noncompliance and the low CD4 count. I have discussed options including a once daily option and he agrees that this might be a better option. He will be changed to Prezista, Norvir and continue Truvada. He will stop Isentress.  He was also provided numbers to establish with a primary care physician

## 2012-05-05 ENCOUNTER — Other Ambulatory Visit: Payer: Self-pay | Admitting: *Deleted

## 2012-05-05 DIAGNOSIS — B20 Human immunodeficiency virus [HIV] disease: Secondary | ICD-10-CM

## 2012-05-05 MED ORDER — DARUNAVIR ETHANOLATE 800 MG PO TABS: 800.0000 mg | ORAL_TABLET | Freq: Every day | ORAL | Status: DC

## 2012-05-05 MED ORDER — EMTRICITABINE-TENOFOVIR DF 200-300 MG PO TABS: 1.0000 | ORAL_TABLET | Freq: Every day | ORAL | Status: DC

## 2012-05-05 MED ORDER — RITONAVIR 100 MG PO TABS
100.0000 mg | ORAL_TABLET | Freq: Every day | ORAL | Status: DC
Start: 2012-05-05 — End: 2012-12-03

## 2012-05-21 ENCOUNTER — Other Ambulatory Visit: Payer: Self-pay

## 2012-05-22 ENCOUNTER — Other Ambulatory Visit: Payer: Self-pay

## 2012-05-22 DIAGNOSIS — G4700 Insomnia, unspecified: Secondary | ICD-10-CM

## 2012-05-22 NOTE — Telephone Encounter (Signed)
Lorazepam refill request. Last refill 02-23-11    Refill denied.  Refill of this medication will need to be discussed with Dr Luciana Axe at next office visit.  Form faxed to [facility]  [phone] [facility].  Laurell Josephs, RN

## 2012-06-02 ENCOUNTER — Other Ambulatory Visit: Payer: Medicare Other

## 2012-06-02 DIAGNOSIS — B20 Human immunodeficiency virus [HIV] disease: Secondary | ICD-10-CM

## 2012-06-03 LAB — CBC WITH DIFFERENTIAL/PLATELET
Basophils Absolute: 0 10*3/uL (ref 0.0–0.1)
Basophils Relative: 0 % (ref 0–1)
Eosinophils Absolute: 0 10*3/uL (ref 0.0–0.7)
Eosinophils Relative: 1 % (ref 0–5)
HCT: 42.5 % (ref 39.0–52.0)
Hemoglobin: 15.2 g/dL (ref 13.0–17.0)
Lymphocytes Relative: 35 % (ref 12–46)
Lymphs Abs: 1.2 10*3/uL (ref 0.7–4.0)
MCH: 34.3 pg — ABNORMAL HIGH (ref 26.0–34.0)
MCHC: 35.8 g/dL (ref 30.0–36.0)
MCV: 95.9 fL (ref 78.0–100.0)
Monocytes Absolute: 0.4 10*3/uL (ref 0.1–1.0)
Monocytes Relative: 11 % (ref 3–12)
Neutro Abs: 1.9 10*3/uL (ref 1.7–7.7)
Neutrophils Relative %: 53 % (ref 43–77)
Platelets: 180 10*3/uL (ref 150–400)
RBC: 4.43 MIL/uL (ref 4.22–5.81)
RDW: 13.6 % (ref 11.5–15.5)
WBC: 3.5 10*3/uL — ABNORMAL LOW (ref 4.0–10.5)

## 2012-06-03 LAB — COMPREHENSIVE METABOLIC PANEL
ALT: 33 U/L (ref 0–53)
AST: 32 U/L (ref 0–37)
Albumin: 4.2 g/dL (ref 3.5–5.2)
Alkaline Phosphatase: 111 U/L (ref 39–117)
BUN: 14 mg/dL (ref 6–23)
CO2: 29 mEq/L (ref 19–32)
Calcium: 9.2 mg/dL (ref 8.4–10.5)
Chloride: 103 mEq/L (ref 96–112)
Creat: 1.29 mg/dL (ref 0.50–1.35)
Glucose, Bld: 89 mg/dL (ref 70–99)
Potassium: 3.9 mEq/L (ref 3.5–5.3)
Sodium: 137 mEq/L (ref 135–145)
Total Bilirubin: 0.9 mg/dL (ref 0.3–1.2)
Total Protein: 7.2 g/dL (ref 6.0–8.3)

## 2012-06-03 LAB — T-HELPER CELL (CD4) - (RCID CLINIC ONLY)
CD4 % Helper T Cell: 18 % — ABNORMAL LOW (ref 33–55)
CD4 T Cell Abs: 210 uL — ABNORMAL LOW (ref 400–2700)

## 2012-06-04 LAB — HIV-1 RNA QUANT-NO REFLEX-BLD
HIV 1 RNA Quant: <20 copies/mL (ref <20)
HIV-1 RNA Quant, Log: <1.3 {Log} (ref <1.30)

## 2012-06-16 ENCOUNTER — Encounter: Payer: Self-pay | Admitting: Internal Medicine

## 2012-06-16 ENCOUNTER — Ambulatory Visit (INDEPENDENT_AMBULATORY_CARE_PROVIDER_SITE_OTHER): Payer: Medicare Other | Admitting: Internal Medicine

## 2012-06-16 VITALS — BP 164/93 | HR 48 | Temp 98.0°F | Ht 70.0 in | Wt 254.0 lb

## 2012-06-16 DIAGNOSIS — Z113 Encounter for screening for infections with a predominantly sexual mode of transmission: Secondary | ICD-10-CM

## 2012-06-16 DIAGNOSIS — B20 Human immunodeficiency virus [HIV] disease: Secondary | ICD-10-CM

## 2012-06-16 NOTE — Assessment & Plan Note (Addendum)
He is doing very well, particularly with the once daily regimen. He is pleased and happy to continue to be compliant with no missed doses now that I got rid of the twice daily medication. Will return in 6 months for routine followup though knows to call if he begins to miss doses again for any known reason. He was offered condoms.  His T-cell count remains in the 200s and appears to not increase despite been undetectable. With continued suppressed viruses though, his overall risk for opportunistic infections and other complications remains low.

## 2012-06-16 NOTE — Progress Notes (Signed)
  Subjective:    Patient ID: Erik Frederick, male    DOB: [birth year], 56 y.o.   MRN: [id number]  HPI He comes in for followup of his HIV. His CD4 remains in the 200s and he also remains undetectable which he has been for quite a while. He previously had issues with missing second dose of his medication but since changing to once daily regimen, he has not missed any doses and is very pleased with the once a day pills pills. He's had no new issues. His blood pressure is elevated and he does have a first appointment with a primary care physician tomorrow.    Review of Systems  Constitutional: Negative for fever and unexpected weight change.  HENT: Negative for sore throat and trouble swallowing.   Respiratory: Negative for cough and shortness of breath.   Cardiovascular: Negative for leg swelling.  Gastrointestinal: Negative for nausea, abdominal pain and diarrhea.  Musculoskeletal: Negative for myalgias, joint swelling and arthralgias.  Skin: Negative for rash.  Neurological: Negative for dizziness and headaches.       Objective:   Physical Exam  Constitutional: He appears well-developed and well-nourished. No distress.  HENT:  Mouth/Throat: No oropharyngeal exudate.  Cardiovascular: Normal rate, regular rhythm and normal heart sounds.  Exam reveals no gallop and no friction rub.   No murmur heard. Pulmonary/Chest: Effort normal and breath sounds normal. No respiratory distress. He has no wheezes. He has no rales.  Lymphadenopathy:    He has no cervical adenopathy.          Assessment & Plan:

## 2012-11-26 ENCOUNTER — Other Ambulatory Visit: Payer: Medicare Other

## 2012-12-01 ENCOUNTER — Other Ambulatory Visit: Payer: Self-pay | Admitting: Infectious Diseases

## 2012-12-01 ENCOUNTER — Other Ambulatory Visit: Payer: Medicare Other

## 2012-12-01 DIAGNOSIS — Z113 Encounter for screening for infections with a predominantly sexual mode of transmission: Secondary | ICD-10-CM

## 2012-12-01 DIAGNOSIS — B20 Human immunodeficiency virus [HIV] disease: Secondary | ICD-10-CM

## 2012-12-02 LAB — T-HELPER CELL (CD4) - (RCID CLINIC ONLY)
CD4 % Helper T Cell: 17 % — ABNORMAL LOW (ref 33–55)
CD4 T Cell Abs: 270 uL — ABNORMAL LOW (ref 400–2700)

## 2012-12-02 LAB — RPR

## 2012-12-03 ENCOUNTER — Other Ambulatory Visit: Payer: Self-pay | Admitting: Licensed Clinical Social Worker

## 2012-12-03 DIAGNOSIS — B20 Human immunodeficiency virus [HIV] disease: Secondary | ICD-10-CM

## 2012-12-03 MED ORDER — DARUNAVIR ETHANOLATE 800 MG PO TABS: 800.0000 mg | ORAL_TABLET | Freq: Every day | ORAL | Status: DC

## 2012-12-03 MED ORDER — EMTRICITABINE-TENOFOVIR DF 200-300 MG PO TABS: 1.0000 | ORAL_TABLET | Freq: Every day | ORAL | Status: DC

## 2012-12-03 MED ORDER — RITONAVIR 100 MG PO TABS: 100.0000 mg | ORAL_TABLET | Freq: Every day | ORAL | Status: DC

## 2012-12-04 LAB — HIV-1 RNA QUANT-NO REFLEX-BLD
HIV 1 RNA Quant: <20 copies/mL (ref <20)
HIV-1 RNA Quant, Log: <1.3 {Log} (ref <1.30)

## 2012-12-10 ENCOUNTER — Ambulatory Visit: Payer: Medicare Other | Admitting: Internal Medicine

## 2012-12-30 ENCOUNTER — Encounter: Payer: Self-pay | Admitting: Internal Medicine

## 2012-12-30 ENCOUNTER — Ambulatory Visit (INDEPENDENT_AMBULATORY_CARE_PROVIDER_SITE_OTHER): Payer: Medicare Other | Admitting: Internal Medicine

## 2012-12-30 VITALS — BP 161/89 | HR 52 | Temp 98.2°F | Ht 70.5 in | Wt 250.0 lb

## 2012-12-30 DIAGNOSIS — B20 Human immunodeficiency virus [HIV] disease: Secondary | ICD-10-CM

## 2012-12-30 DIAGNOSIS — R197 Diarrhea, unspecified: Secondary | ICD-10-CM | POA: Insufficient documentation

## 2012-12-30 MED ORDER — DIPHENOXYLATE-ATROPINE 2.5-0.025 MG PO TABS: 1.0000 | ORAL_TABLET | Freq: Four times a day (QID) | ORAL | Status: DC | PRN

## 2012-12-30 NOTE — Assessment & Plan Note (Signed)
He is doing well with this regimen and did have an increase of his CD4 count. I hope this persists and maybe even continues to climb with the new regimen. His virus though remains undetectable and he is doing well.

## 2012-12-30 NOTE — Progress Notes (Signed)
  Subjective:    Patient ID: Erik Frederick, male    DOB: Apr 17, 1956, 57 y.o.   MRN: [id number]  HPI He comes in for followup of HIV.he had been on Isentress and Truvada but had missed some of the doses and he was switched to Prezista, Norvir and Truvada and denies any missed doses.  He continues to do well with this regimen and overall feels well with this. No recent rashes, weight loss. His CD4 is up to 270 which is improved from 210 previously and his virus remains undetectable.  He is complaining of persistent diarrhea she has had for over 6 months. Previously this was controlled with diet modifications he did but is not now stable despite the diet modifications that previously had worked. He has not had any weight loss. He has had more stress and is smoking more and drinking more though not to any excess. He has accepted the diarrhea as part of HIV however now is having difficulty while at work.   Review of Systems  Constitutional: Negative for activity change, fatigue and unexpected weight change.  HENT: Negative for sore throat and trouble swallowing.   Respiratory: Negative for shortness of breath.   Cardiovascular: Negative for chest pain and leg swelling.  Gastrointestinal: Positive for diarrhea. Negative for nausea and abdominal pain.  Musculoskeletal: Negative for myalgias, joint swelling and arthralgias.  Skin: Negative for rash.  Neurological: Negative for dizziness and headaches.       Objective:   Physical Exam  Constitutional: He appears well-developed and well-nourished. No distress.  HENT:  Mouth/Throat: Oropharynx is clear and moist. No oropharyngeal exudate.  Cardiovascular: Normal rate, regular rhythm and normal heart sounds.  Exam reveals no gallop and no friction rub.   No murmur heard. Pulmonary/Chest: Effort normal and breath sounds normal. No respiratory distress. He has no wheezes. He has no rales.  Lymphadenopathy:    He has no cervical adenopathy.  Skin:  No rash noted.          Assessment & Plan:

## 2012-12-30 NOTE — Assessment & Plan Note (Signed)
He has had persistent diarrhea for months but no concerning signs with no weight loss, no blood.  I do not feel this is related to HIV directly with suppressed virus, good compliance.  I will trial lomotil which he will use while at work and if it persists, he may need evaluation with GI.  He will also discuss with his primary doctor at next visit.

## 2013-03-16 ENCOUNTER — Other Ambulatory Visit: Payer: Self-pay | Admitting: *Deleted

## 2013-03-16 DIAGNOSIS — B20 Human immunodeficiency virus [HIV] disease: Secondary | ICD-10-CM

## 2013-03-16 MED ORDER — DARUNAVIR ETHANOLATE 800 MG PO TABS: 800.0000 mg | ORAL_TABLET | Freq: Every day | ORAL | Status: DC

## 2013-03-16 MED ORDER — EMTRICITABINE-TENOFOVIR DF 200-300 MG PO TABS: 1.0000 | ORAL_TABLET | Freq: Every day | ORAL | Status: DC

## 2013-03-16 MED ORDER — RITONAVIR 100 MG PO TABS: 100.0000 mg | ORAL_TABLET | Freq: Every day | ORAL | Status: DC

## 2013-03-24 ENCOUNTER — Other Ambulatory Visit: Payer: Medicare Other

## 2013-04-07 ENCOUNTER — Ambulatory Visit: Payer: Medicare Other | Admitting: Internal Medicine

## 2013-05-12 ENCOUNTER — Other Ambulatory Visit: Payer: Self-pay | Admitting: Internal Medicine

## 2013-05-12 ENCOUNTER — Ambulatory Visit (INDEPENDENT_AMBULATORY_CARE_PROVIDER_SITE_OTHER): Payer: Medicare Other | Admitting: Internal Medicine

## 2013-05-12 ENCOUNTER — Encounter: Payer: Self-pay | Admitting: Internal Medicine

## 2013-05-12 VITALS — BP 176/92 | HR 58 | Temp 98.5°F | Ht 70.5 in | Wt 244.0 lb

## 2013-05-12 DIAGNOSIS — F17200 Nicotine dependence, unspecified, uncomplicated: Secondary | ICD-10-CM

## 2013-05-12 DIAGNOSIS — Z23 Encounter for immunization: Secondary | ICD-10-CM

## 2013-05-12 DIAGNOSIS — B1920 Unspecified viral hepatitis C without hepatic coma: Secondary | ICD-10-CM | POA: Insufficient documentation

## 2013-05-12 DIAGNOSIS — B20 Human immunodeficiency virus [HIV] disease: Secondary | ICD-10-CM

## 2013-05-12 DIAGNOSIS — R197 Diarrhea, unspecified: Secondary | ICD-10-CM

## 2013-05-12 DIAGNOSIS — Z720 Tobacco use: Secondary | ICD-10-CM

## 2013-05-12 NOTE — Assessment & Plan Note (Signed)
I will check to see if he has active virus

## 2013-05-12 NOTE — Assessment & Plan Note (Signed)
I discussed with him the perils of smoking. He was given information for the muscular clot line and he is interested in quitting again. He understands risk for stroke, heart disease and cancers. He is also going to discuss this with his primary doctor.

## 2013-05-12 NOTE — Assessment & Plan Note (Signed)
He continues to have problems despite the supportive therapy. I have asked him to discuss this with his primary physician and consider referral to gastroenterology if indicated through his primary doctor.

## 2013-05-12 NOTE — Progress Notes (Signed)
  Subjective:    Patient ID: Erik Frederick, male    DOB: Nov 15, 1955, 57 y.o.   MRN: [id number]  HPI He comes in for followup of his HIV. He continues on Prezista, Norvir and Truvada. He denies any missed doses. Previously he had intermittent compliance and so was switched to his current regimen. His CD4 remains in the 200s however last time was noted to increase up to 270. His viral load has remained undetectable. Last labs or months ago. Unfortunately, he restarted smoking about 6 months ago after having been quit for 5 years. He also continues to have issues with diarrhea, which she equates to his cholecystectomy.   Review of Systems  Constitutional: Negative for fatigue.  HENT: Negative for sore throat.   Eyes: Negative for visual disturbance.  Respiratory: Negative for cough and shortness of breath.   Cardiovascular: Negative for leg swelling.  Gastrointestinal: Positive for diarrhea. Negative for nausea, vomiting, abdominal pain and abdominal distention.  Musculoskeletal: Negative for myalgias and arthralgias.  Skin: Negative for rash.  Neurological: Negative for dizziness, light-headedness and headaches.  Hematological: Negative for adenopathy.  Psychiatric/Behavioral: Negative for dysphoric mood.       Objective:   Physical Exam  Constitutional: He appears well-developed and well-nourished. No distress.  HENT:  Mouth/Throat: No oropharyngeal exudate.  Eyes: No scleral icterus.  Cardiovascular: Normal rate, regular rhythm and normal heart sounds.   No murmur heard. Pulmonary/Chest: Effort normal and breath sounds normal. No respiratory distress. He has no wheezes.  Lymphadenopathy:    He has no cervical adenopathy.  Neurological: He is alert.  Skin: Skin is warm and dry. No rash noted.  Psychiatric: He has a normal mood and affect. His behavior is normal.          Assessment & Plan:

## 2013-05-12 NOTE — Assessment & Plan Note (Signed)
He endorses good compliance. I will check his labs today and he will come back in 3 months. There any concerns, he will be called sooner

## 2013-05-13 LAB — T-HELPER CELL (CD4) - (RCID CLINIC ONLY)
CD4 % Helper T Cell: 17 % — ABNORMAL LOW (ref 33–55)
CD4 T Cell Abs: 220 /uL — ABNORMAL LOW (ref 400–2700)

## 2013-05-13 LAB — HIV-1 RNA QUANT-NO REFLEX-BLD
HIV 1 RNA Quant: <20 copies/mL (ref <20)
HIV-1 RNA Quant, Log: <1.3 {Log} (ref <1.30)

## 2013-05-13 LAB — HEPATITIS C RNA QUANTITATIVE
HCV Quantitative Log: 6.54 {Log} — ABNORMAL HIGH (ref <1.18)
HCV Quantitative: 3477775 IU/mL — ABNORMAL HIGH (ref <15)

## 2013-05-15 ENCOUNTER — Other Ambulatory Visit: Payer: Self-pay | Admitting: Internal Medicine

## 2013-05-15 DIAGNOSIS — B1920 Unspecified viral hepatitis C without hepatic coma: Secondary | ICD-10-CM

## 2013-05-18 NOTE — Addendum Note (Signed)
Addended by: Mariea Clonts D on: 05/18/2013 12:23 PM   Modules accepted: Orders

## 2013-05-19 LAB — HIV-1 GENOTYPR PLUS

## 2013-08-11 ENCOUNTER — Ambulatory Visit: Payer: Medicare Other | Admitting: Internal Medicine

## 2013-08-18 ENCOUNTER — Ambulatory Visit (INDEPENDENT_AMBULATORY_CARE_PROVIDER_SITE_OTHER): Payer: Medicare Other | Admitting: Internal Medicine

## 2013-08-18 ENCOUNTER — Encounter: Payer: Self-pay | Admitting: Internal Medicine

## 2013-08-18 VITALS — BP 176/81 | HR 65 | Temp 98.1°F | Ht 71.0 in | Wt 253.0 lb

## 2013-08-18 DIAGNOSIS — B20 Human immunodeficiency virus [HIV] disease: Secondary | ICD-10-CM

## 2013-08-18 DIAGNOSIS — Z720 Tobacco use: Secondary | ICD-10-CM

## 2013-08-18 DIAGNOSIS — F17200 Nicotine dependence, unspecified, uncomplicated: Secondary | ICD-10-CM

## 2013-08-18 DIAGNOSIS — B1920 Unspecified viral hepatitis C without hepatic coma: Secondary | ICD-10-CM

## 2013-08-18 LAB — CBC WITH DIFFERENTIAL/PLATELET
Basophils Absolute: 0 10*3/uL (ref 0.0–0.1)
Basophils Relative: 0 % (ref 0–1)
Eosinophils Absolute: 0 10*3/uL (ref 0.0–0.7)
Eosinophils Relative: 1 % (ref 0–5)
HCT: 42.4 % (ref 39.0–52.0)
Hemoglobin: 15.6 g/dL (ref 13.0–17.0)
Lymphocytes Relative: 42 % (ref 12–46)
Lymphs Abs: 1.2 10*3/uL (ref 0.7–4.0)
MCH: 34.7 pg — ABNORMAL HIGH (ref 26.0–34.0)
MCHC: 36.8 g/dL — ABNORMAL HIGH (ref 30.0–36.0)
MCV: 94.4 fL (ref 78.0–100.0)
Monocytes Absolute: 0.3 10*3/uL (ref 0.1–1.0)
Monocytes Relative: 10 % (ref 3–12)
Neutro Abs: 1.3 10*3/uL — ABNORMAL LOW (ref 1.7–7.7)
Neutrophils Relative %: 47 % (ref 43–77)
Platelets: 184 10*3/uL (ref 150–400)
RBC: 4.49 MIL/uL (ref 4.22–5.81)
RDW: 14.3 % (ref 11.5–15.5)
WBC: 2.8 10*3/uL — ABNORMAL LOW (ref 4.0–10.5)

## 2013-08-18 LAB — COMPLETE METABOLIC PANEL WITH GFR
ALT: 40 U/L (ref 0–53)
AST: 40 U/L — ABNORMAL HIGH (ref 0–37)
Albumin: 4.3 g/dL (ref 3.5–5.2)
Alkaline Phosphatase: 116 U/L (ref 39–117)
BUN: 9 mg/dL (ref 6–23)
CO2: 24 mEq/L (ref 19–32)
Calcium: 8.6 mg/dL (ref 8.4–10.5)
Chloride: 102 mEq/L (ref 96–112)
Creat: 1.05 mg/dL (ref 0.50–1.35)
GFR, Est African American: >89 mL/min
GFR, Est Non African American: 78 mL/min
Glucose, Bld: 105 mg/dL — ABNORMAL HIGH (ref 70–99)
Potassium: 3.7 mEq/L (ref 3.5–5.3)
Sodium: 136 mEq/L (ref 135–145)
Total Bilirubin: 0.8 mg/dL (ref 0.3–1.2)
Total Protein: 7.5 g/dL (ref 6.0–8.3)

## 2013-08-18 LAB — IRON: Iron: 75 ug/dL (ref 42–165)

## 2013-08-18 LAB — PROTIME-INR
INR: 1.07 (ref <1.50)
Prothrombin Time: 13.8 seconds (ref 11.6–15.2)

## 2013-08-18 NOTE — Assessment & Plan Note (Signed)
Has now quit again and feels he will stay off.

## 2013-08-18 NOTE — Progress Notes (Signed)
  Subjective:    Patient ID: Erik Frederick, male    DOB: 02-Jun-1956, 58 y.o.   MRN: [id number]  HPI  He comes in for followup of his HIV. He continues on Prezista, Norvir and Truvada. He denies any missed doses. Previously he had intermittent compliance and so was switched to his current regimen. His CD4 remains in the 200s, no labs prior to this vist. His viral load has remained undetectable. He again quit smoking after briefly restarting.     He does have hepatitis C and a recent RNA confirms active disease.  He does not have any overt signs of cirrhosis with nl platelets and albumin.  He has not had an evaluation for hep C.  He is interested in treatment.  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  No drug use since then.  He does drink beer regularly, though not daily.  He is interested in quitting alcohol.  Does not take more than 1-2 grams of acetaminophen per week.  Takes Garlic tabs for his HTN.  Has not seen his PCP for a while and needs BP meds refilled.     Review of Systems  Constitutional: Negative for fatigue.  HENT: Negative for sore throat.   Eyes: Negative for visual disturbance.  Respiratory: Negative for cough and shortness of breath.   Cardiovascular: Negative for leg swelling.  Gastrointestinal: Negative for nausea, vomiting, abdominal pain, diarrhea and abdominal distention.  Musculoskeletal: Negative for arthralgias.  Skin: Negative for rash.  Neurological: Negative for dizziness, light-headedness and headaches.  Hematological: Negative for adenopathy.  Psychiatric/Behavioral: Negative for dysphoric mood.       Objective:   Physical Exam  Constitutional: He appears well-developed and well-nourished. No distress.  HENT:  Mouth/Throat: No oropharyngeal exudate.  Eyes: No scleral icterus.  Cardiovascular: Normal rate, regular rhythm and normal heart sounds.   No murmur heard. Pulmonary/Chest: Effort normal and breath sounds normal. No respiratory  distress. He has no wheezes.  Abdominal: Soft. Bowel sounds are normal. He exhibits no distension.  Musculoskeletal: He exhibits no edema.  Lymphadenopathy:    He has no cervical adenopathy.  Neurological: He is alert.  Skin: Skin is warm and dry. No rash noted.  Psychiatric: He has a normal mood and affect. His behavior is normal.          Assessment & Plan:

## 2013-08-18 NOTE — Assessment & Plan Note (Addendum)
I discussed options of treatment, avoidance of liver toxic agents like alcohol and limiting Tylenol.  I will evaluate with genotype and other appropriate labs and consider treatment.  Will get US to see if he needs GI evaluation.  Labs to evaulate for stage of fibrosis.

## 2013-08-18 NOTE — Assessment & Plan Note (Signed)
He is doing well and denies missed doses now.  Will check labs today.  RTC 3 months.

## 2013-08-19 LAB — HIV-1 RNA QUANT-NO REFLEX-BLD
HIV 1 RNA Quant: <20 copies/mL (ref <20)
HIV-1 RNA Quant, Log: <1.3 {Log} (ref <1.30)

## 2013-08-19 LAB — RPR

## 2013-08-19 LAB — ANA: Anti Nuclear Antibody(ANA): NEGATIVE

## 2013-08-20 ENCOUNTER — Other Ambulatory Visit: Payer: Medicare Other

## 2013-08-20 LAB — T-HELPER CELL (CD4) - (RCID CLINIC ONLY)
CD4 % Helper T Cell: 24 % — ABNORMAL LOW (ref 33–55)
CD4 T Cell Abs: 290 /uL — ABNORMAL LOW (ref 400–2700)

## 2013-08-21 LAB — HEPATITIS C RNA QUANTITATIVE
HCV Quantitative Log: 6.77 {Log} — ABNORMAL HIGH (ref <1.18)
HCV Quantitative: 5823796 IU/mL — ABNORMAL HIGH (ref <15)

## 2013-08-21 LAB — HEPATITIS C GENOTYPE

## 2013-09-04 ENCOUNTER — Ambulatory Visit
Admission: RE | Admit: 2013-09-04 | Discharge: 2013-09-04 | Disposition: A | Discharge status: Home / Self Care | Payer: Medicare Other | Source: Ambulatory Visit | Attending: Internal Medicine | Admitting: Internal Medicine

## 2013-09-10 ENCOUNTER — Encounter: Payer: Self-pay | Admitting: Internal Medicine

## 2013-09-12 ENCOUNTER — Other Ambulatory Visit: Payer: Self-pay | Admitting: Internal Medicine

## 2013-09-14 ENCOUNTER — Other Ambulatory Visit: Payer: Self-pay | Admitting: Internal Medicine

## 2013-09-14 DIAGNOSIS — B1920 Unspecified viral hepatitis C without hepatic coma: Secondary | ICD-10-CM

## 2013-09-15 ENCOUNTER — Telehealth: Payer: Self-pay | Admitting: *Deleted

## 2013-09-15 ENCOUNTER — Other Ambulatory Visit: Payer: Self-pay | Admitting: Internal Medicine

## 2013-09-15 NOTE — Telephone Encounter (Signed)
Called patient and notified him of ultrasound appt for Friday, 09/18/13 at 8:15 AM at [facility]. Nothing to eat or drink after midnight. [name]

## 2013-09-18 ENCOUNTER — Ambulatory Visit (HOSPITAL_COMMUNITY): Payer: Medicare Other

## 2013-09-22 ENCOUNTER — Ambulatory Visit (HOSPITAL_COMMUNITY): Payer: Medicare Other

## 2013-09-25 ENCOUNTER — Ambulatory Visit (HOSPITAL_COMMUNITY): Payer: Medicare Other

## 2013-11-02 ENCOUNTER — Other Ambulatory Visit: Payer: Medicare Other

## 2013-11-02 DIAGNOSIS — B20 Human immunodeficiency virus [HIV] disease: Secondary | ICD-10-CM

## 2013-11-04 LAB — T-HELPER CELL (CD4) - (RCID CLINIC ONLY)
CD4 % Helper T Cell: 20 % — ABNORMAL LOW (ref 33–55)
CD4 T Cell Abs: 330 /uL — ABNORMAL LOW (ref 400–2700)

## 2013-11-04 LAB — HIV-1 RNA QUANT-NO REFLEX-BLD
HIV 1 RNA Quant: <20 copies/mL (ref <20)
HIV-1 RNA Quant, Log: <1.3 {Log} (ref <1.30)

## 2013-11-05 ENCOUNTER — Other Ambulatory Visit: Payer: Self-pay | Admitting: *Deleted

## 2013-11-05 DIAGNOSIS — B20 Human immunodeficiency virus [HIV] disease: Secondary | ICD-10-CM

## 2013-11-05 MED ORDER — DARUNAVIR ETHANOLATE 800 MG PO TABS: 800.0000 mg | ORAL_TABLET | Freq: Every day | ORAL | Status: DC

## 2013-11-05 MED ORDER — EMTRICITABINE-TENOFOVIR DF 200-300 MG PO TABS: 1.0000 | ORAL_TABLET | Freq: Every day | ORAL | Status: DC

## 2013-11-05 MED ORDER — RITONAVIR 100 MG PO TABS: 100.0000 mg | ORAL_TABLET | Freq: Every day | ORAL | Status: DC

## 2013-11-05 NOTE — Telephone Encounter (Signed)
Printed for ADAP application.

## 2013-11-23 ENCOUNTER — Telehealth: Payer: Self-pay | Admitting: *Deleted

## 2013-11-23 ENCOUNTER — Ambulatory Visit: Payer: Medicare Other | Admitting: Internal Medicine

## 2013-11-23 NOTE — Telephone Encounter (Signed)
Spoke with patient, reminded him of his appointment.  Pt at work, will try to leave and come.  Dr. Luciana Axeomer will see him if he is able to get here. Andree CossMichelle M Dayannara Pascal, RN

## 2013-11-24 ENCOUNTER — Ambulatory Visit: Payer: Medicare Other | Admitting: Internal Medicine

## 2013-11-26 ENCOUNTER — Encounter: Payer: Self-pay | Admitting: Internal Medicine

## 2013-11-26 ENCOUNTER — Ambulatory Visit (INDEPENDENT_AMBULATORY_CARE_PROVIDER_SITE_OTHER): Payer: Medicare Other | Admitting: Internal Medicine

## 2013-11-26 ENCOUNTER — Telehealth: Payer: Self-pay | Admitting: *Deleted

## 2013-11-26 VITALS — BP 154/92 | HR 61 | Temp 98.2°F | Ht 71.0 in | Wt 253.0 lb

## 2013-11-26 DIAGNOSIS — B20 Human immunodeficiency virus [HIV] disease: Secondary | ICD-10-CM

## 2013-11-26 DIAGNOSIS — B1920 Unspecified viral hepatitis C without hepatic coma: Secondary | ICD-10-CM

## 2013-11-26 MED ORDER — DOLUTEGRAVIR SODIUM 50 MG PO TABS: 50.0000 mg | ORAL_TABLET | Freq: Every day | ORAL | Status: DC

## 2013-11-26 NOTE — Telephone Encounter (Signed)
Made elastography appointment for 4/20 9:00 (8:45) arrival with patient in the room.  Patient knows to come to the main entrance of [facility] 15 minutes early, should be NPO after midnight.  Pt accepted appointment. Erik CossMichelle M Daking Westervelt, RN

## 2013-11-26 NOTE — Progress Notes (Signed)
  Subjective:    Patient ID: Erik Frederick, male    DOB: March 01, 1956, 58 y.o.   MRN: [id number]  HPI  He comes in for followup of his HIV and hepatitis C. He continues on Prezista, Norvir and Truvada. He denies any missed doses. Previously he had intermittent compliance and so was switched to his current regimen but has been very compliant now with no missed doses and feels well. His CD4 is up to 360. His viral load has remained undetectable. He is working on smoking cessation.    He does have hepatitis C and a recent RNA confirms active disease, genotype 1b. Ultasound ok.  He is interested in treatment.  Elastography is pending.  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  No drug use since then.  He does drink occasionally, never daily but in the past drank regularly.   Never treated for hepatitis C.     Review of Systems  Constitutional: Negative for fatigue.  HENT: Negative for sore throat.   Eyes: Negative for visual disturbance.  Respiratory: Negative for cough and shortness of breath.   Cardiovascular: Negative for leg swelling.  Gastrointestinal: Negative for nausea, vomiting, abdominal pain, diarrhea and abdominal distention.  Musculoskeletal: Negative for arthralgias.  Skin: Negative for rash.  Neurological: Negative for dizziness, light-headedness and headaches.  Hematological: Negative for adenopathy.  Psychiatric/Behavioral: Negative for dysphoric mood.       Objective:   Physical Exam  Constitutional: He appears well-developed and well-nourished. No distress.  HENT:  Mouth/Throat: No oropharyngeal exudate.  Eyes: No scleral icterus.  Cardiovascular: Normal rate, regular rhythm and normal heart sounds.   No murmur heard. Pulmonary/Chest: Effort normal and breath sounds normal. No respiratory distress. He has no wheezes.  Abdominal: Soft. Bowel sounds are normal. He exhibits no distension.  Musculoskeletal: He exhibits no edema.  Lymphadenopathy:    He has no  cervical adenopathy.  Neurological: He is alert.  Skin: Skin is warm and dry. No rash noted.  Psychiatric: He has a normal mood and affect. His behavior is normal.          Assessment & Plan:

## 2013-11-26 NOTE — Assessment & Plan Note (Addendum)
Discussed treatment options for genotype 1b.  Will need elastography for staging.  APRI noted.  Mild AST elevation.  Counseled on not drinking alcohol.  Does not use much acetaminophen.  No drug use for > 10 years.   Will prescribe Harvoni for 12 weeks.    Counseled on transmission.

## 2013-11-26 NOTE — Patient Instructions (Addendum)
Date 11/26/2013  Dear Mr Erik Frederick, As discussed in the ID Clinic, your hepatitis C therapy will include the following medications:           Harvoni 90mg /400mg  tablet:           Take 1 tablet by mouth once daily   Please note that ALL MEDICATIONS WILL START ON THE SAME DATE for a total of _12weeks. ---------------------------------------------------------------- Your HCV Treatment Start Date: TBA   Your HCV genotype:  1b    Liver Fibrosis:    TBA ---------------------------------------------------------------- YOUR PHARMACY CONTACT:   [facility] [address] Phone: [phone] Hours: Monday to Friday 7:30 am to 6:00 pm   Please always contact your pharmacy at least 3-4 business days before you run out of medications to ensure your next month's medication is ready or 1 week prior to running out if you receive it by mail.  Remember, each prescription is for 28 days. ---------------------------------------------------------------- GENERAL NOTES REGARDING YOUR HEPATITIS C MEDICATIONS:   SOFOSBUVIR/LEDIPASVIR (HARVONI): - Harvoni tablet is taken daily with OR without food. - The tablets are orange. - The tablets should be stored at room temperature.  - Acid reducing agents such as H2 blockers (ie. Pepcid (famotidine), Zantac (ranitidine), Tagamet (cimetidine), Axid (nizatidine) and proton pump inhibitors (ie. Prilosec (omeprazole), Protonix (pantoprazole), Nexium (esomeprazole), or Aciphex (rabeprazole)) can decrease effectiveness of Harvoni. Do not take until you have discussed with a health care provider.    -Antacids that contain magnesium and/or aluminum hydroxide (ie. Milk of Magensia, Rolaids, Gaviscon, Maalox, Mylanta, an dArthritis Pain Formula)can reduce absorption of Harvoni, so take them at least 4 hours before or after Harvoni.  -Calcium carbonate (calcium supplements or antacids such as Tums, Caltrate,  Os-Cal)needs to be taken at least 4 hours hours before or after Harvoni.  -St. John's wort or any products that contain St. John's wort like some herbal supplements  Please inform the office prior to starting any of these medications.  - The common side effects with Harvoni:      1. Fatigue      2. Headache      3. Nausea      4. Diarrhea      5. Insomnia   Support Path is a suite of resources designed to help patients start with HARVONI and move toward treatment completion GETTING STARTED Support Path helps patients access therapy and get off to an efficient start  Benefits investigation and prior authorization support Co-pay and other financial assistance A specialty pharmacy finder CO-PAY COUPON The HARVONI co-pay coupon may help eligible patients lower their out-of-pocket costs. With a co-pay coupon, most eligible patients may pay no more than $5 per co-pay (restrictions apply) www.harvoni.com call 954-660-43561-210-635-8741 Not valid for patients enrolled in government healthcare prescription drug programs, such as Medicare Part D and Medicaid. Patients in the coverage gap known as the "donut hole" also are not eligible The HARVONI co-pay coupon program will cover the out-of-pocket costs for HARVONI prescriptions up to a maximum of 25% of the catalog price of a 12-week regimen of HARVONI  Please note that this only lists the most common side effects and is NOT a comprehensive list of the potential side effects of these medications. For more information, please review the drug information sheets that come with your medication package from the pharmacy.  ---------------------------------------------------------------- GENERAL HELPFUL HINTS ON HCV THERAPY: 1. No alcohol. 2. Protect against sun-sensitivity/sunburns (wear sunglasses, hat, long sleeves,  pants and sunscreen). 3. Stay well-hydrated/well-moisturized. 4. Notify the ID Clinic of any changes in your other over-the-counter/herbal or  prescription medications. 5. If you miss a dose of your medication, take the missed dose as soon as you remember. Return to your regular time/dose schedule the next day.  6.  Do not stop taking your medications without first talking with your healthcare provider. 7.  You may take Tylenol (acetaminophen), as long as the dose is less than 2000 mg (OR no more than 4 tablets of the Tylenol Extra Strengths 500mg  tablet) in 24 hours. 8.  You will need to obtain routine labs and/or office visits at [facility] at weeks 2, 4, 8,  and 12 as well as 12 and 24 weeks after completion of treatment.  If you are not compliant with labs and office visits, we may discontinue HCV therapy.  Erik Barefootobert W Alcide Memoli, MD  [facility] [address] [place], [place]  [id number] [phone]

## 2013-11-26 NOTE — Assessment & Plan Note (Signed)
Doing well with HIV.  I will change his regimen to Tivicay with Truvada in anticipation of Harvoni.  He can then stay on this.  RTC 2 months.

## 2013-11-30 ENCOUNTER — Ambulatory Visit (HOSPITAL_COMMUNITY)
Admission: RE | Admit: 2013-11-30 | Discharge: 2013-11-30 | Disposition: A | Discharge status: Home / Self Care | Payer: Medicare Other | Source: Ambulatory Visit | Attending: Internal Medicine | Admitting: Internal Medicine

## 2013-11-30 ENCOUNTER — Other Ambulatory Visit: Payer: Self-pay | Admitting: Internal Medicine

## 2013-11-30 DIAGNOSIS — B1920 Unspecified viral hepatitis C without hepatic coma: Secondary | ICD-10-CM | POA: Insufficient documentation

## 2013-11-30 MED ORDER — LEDIPASVIR-SOFOSBUVIR 90-400 MG PO TABS: 1.0000 | ORAL_TABLET | Freq: Every day | ORAL | Status: DC

## 2013-12-09 ENCOUNTER — Other Ambulatory Visit: Payer: Self-pay | Admitting: *Deleted

## 2013-12-09 ENCOUNTER — Other Ambulatory Visit: Payer: Self-pay | Admitting: Internal Medicine

## 2013-12-09 DIAGNOSIS — B1920 Unspecified viral hepatitis C without hepatic coma: Secondary | ICD-10-CM

## 2013-12-09 DIAGNOSIS — B20 Human immunodeficiency virus [HIV] disease: Secondary | ICD-10-CM

## 2013-12-09 MED ORDER — EMTRICITABINE-TENOFOVIR DF 200-300 MG PO TABS: 1.0000 | ORAL_TABLET | Freq: Every day | ORAL | Status: DC

## 2014-01-15 ENCOUNTER — Telehealth: Payer: Self-pay | Admitting: *Deleted

## 2014-01-15 NOTE — Telephone Encounter (Signed)
Call from [facility] patient's Erik Frederick was approved for 12 weeks. Authorization # [id number] [name]

## 2014-01-18 NOTE — Telephone Encounter (Signed)
Notified [facility] that the Harvoni was approved. Andree Coss, RN

## 2014-01-26 ENCOUNTER — Ambulatory Visit: Payer: Medicare Other | Admitting: Internal Medicine

## 2014-02-02 ENCOUNTER — Encounter: Payer: Self-pay | Admitting: Internal Medicine

## 2014-02-02 ENCOUNTER — Ambulatory Visit (INDEPENDENT_AMBULATORY_CARE_PROVIDER_SITE_OTHER): Payer: Medicare Other | Admitting: Internal Medicine

## 2014-02-02 VITALS — BP 166/98 | HR 52 | Temp 98.3°F | Ht 71.0 in | Wt 253.0 lb

## 2014-02-02 DIAGNOSIS — Z23 Encounter for immunization: Secondary | ICD-10-CM

## 2014-02-02 DIAGNOSIS — K7460 Unspecified cirrhosis of liver: Secondary | ICD-10-CM

## 2014-02-02 DIAGNOSIS — B20 Human immunodeficiency virus [HIV] disease: Secondary | ICD-10-CM

## 2014-02-02 DIAGNOSIS — B182 Chronic viral hepatitis C: Secondary | ICD-10-CM

## 2014-02-02 NOTE — Progress Notes (Signed)
  Subjective:    Patient ID: Erik Frederick, male    DOB: 10-24-1955, 58 y.o.   MRN: [id number]  HPI  He comes in for followup of his HIV and hepatitis C. He was changed to Tivicay and Truvada in anticipation of starting Harvoni and did start on 01/22/14. He denies any missed doses.    He has genotype 1b with initial viral RNA of [id number]. Ultasound ok.  He is interested in treatment.  Elastography is F4.   He is up to date with hepatitis B vaccine, pneumovax.  Hep A vaccine today.    Drinks alcohol daily.  Does not feel he would have any issues with withdrawal if he stopped but will let us know otherwise if he needs substance abuse counselor.     Review of Systems  Constitutional: Negative for fatigue.  HENT: Negative for sore throat.   Eyes: Negative for visual disturbance.  Respiratory: Negative for cough and shortness of breath.   Cardiovascular: Negative for leg swelling.  Gastrointestinal: Negative for nausea, vomiting, abdominal pain, diarrhea and abdominal distention.  Musculoskeletal: Negative for arthralgias.  Skin: Negative for rash.  Neurological: Negative for dizziness, light-headedness and headaches.  Hematological: Negative for adenopathy.  Psychiatric/Behavioral: Negative for dysphoric mood.       Objective:   Physical Exam  Constitutional: He appears well-developed and well-nourished. No distress.  HENT:  Mouth/Throat: No oropharyngeal exudate.  Eyes: No scleral icterus.  Cardiovascular: Normal rate, regular rhythm and normal heart sounds.   No murmur heard. Pulmonary/Chest: Effort normal and breath sounds normal. No respiratory distress. He has no wheezes.  Abdominal: Soft. Bowel sounds are normal. He exhibits no distension.  Musculoskeletal: He exhibits no edema.  Lymphadenopathy:    He has no cervical adenopathy.  Neurological: He is alert.  Skin: Skin is warm and dry. No rash noted.  Psychiatric: He has a normal mood and affect. His behavior is normal.           Assessment & Plan:

## 2014-02-02 NOTE — Assessment & Plan Note (Signed)
I will refer him to GI.  I did discuss the need to completely stop drinking alcohol.  Previously he told me he drank occasionally, but now endorses daily drinking.  With his elastography result of F4, I discussed the urgency of stopping.

## 2014-02-02 NOTE — Addendum Note (Signed)
Addended by: Andree CossHOWELL, Sharine Cadle M on: 02/02/2014 04:24 PM   Modules accepted: Orders

## 2014-02-02 NOTE — Assessment & Plan Note (Signed)
I will check his viral load with his next lab visit.

## 2014-02-02 NOTE — Assessment & Plan Note (Signed)
Doing well.  Will check 4 week labs about 7/6 and he will follow up after that.

## 2014-02-05 ENCOUNTER — Encounter: Payer: Self-pay | Admitting: Gastroenterology

## 2014-02-15 ENCOUNTER — Other Ambulatory Visit: Payer: Medicare Other

## 2014-02-15 DIAGNOSIS — B20 Human immunodeficiency virus [HIV] disease: Secondary | ICD-10-CM

## 2014-02-15 DIAGNOSIS — B182 Chronic viral hepatitis C: Secondary | ICD-10-CM

## 2014-02-16 LAB — COMPLETE METABOLIC PANEL WITH GFR
ALT: 28 U/L (ref 0–53)
AST: 27 U/L (ref 0–37)
Albumin: 4.3 g/dL (ref 3.5–5.2)
Alkaline Phosphatase: 88 U/L (ref 39–117)
BUN: 12 mg/dL (ref 6–23)
CO2: 27 mEq/L (ref 19–32)
Calcium: 9.1 mg/dL (ref 8.4–10.5)
Chloride: 105 mEq/L (ref 96–112)
Creat: 1.05 mg/dL (ref 0.50–1.35)
GFR, Est African American: 89 mL/min
GFR, Est Non African American: 77 mL/min
Glucose, Bld: 91 mg/dL (ref 70–99)
Potassium: 3.9 mEq/L (ref 3.5–5.3)
Sodium: 139 mEq/L (ref 135–145)
Total Bilirubin: 0.8 mg/dL (ref 0.2–1.2)
Total Protein: 7.3 g/dL (ref 6.0–8.3)

## 2014-02-16 LAB — CBC WITH DIFFERENTIAL/PLATELET
Basophils Absolute: 0 10*3/uL (ref 0.0–0.1)
Basophils Relative: 0 % (ref 0–1)
Eosinophils Absolute: 0 10*3/uL (ref 0.0–0.7)
Eosinophils Relative: 1 % (ref 0–5)
HCT: 43.3 % (ref 39.0–52.0)
Hemoglobin: 15.3 g/dL (ref 13.0–17.0)
Lymphocytes Relative: 37 % (ref 12–46)
Lymphs Abs: 1.4 10*3/uL (ref 0.7–4.0)
MCH: 33.8 pg (ref 26.0–34.0)
MCHC: 35.3 g/dL (ref 30.0–36.0)
MCV: 95.6 fL (ref 78.0–100.0)
Monocytes Absolute: 0.4 10*3/uL (ref 0.1–1.0)
Monocytes Relative: 11 % (ref 3–12)
Neutro Abs: 2 10*3/uL (ref 1.7–7.7)
Neutrophils Relative %: 51 % (ref 43–77)
Platelets: 187 10*3/uL (ref 150–400)
RBC: 4.53 MIL/uL (ref 4.22–5.81)
RDW: 14.1 % (ref 11.5–15.5)
WBC: 3.9 10*3/uL — ABNORMAL LOW (ref 4.0–10.5)

## 2014-02-16 LAB — T-HELPER CELL (CD4) - (RCID CLINIC ONLY)
CD4 % Helper T Cell: 22 % — ABNORMAL LOW (ref 33–55)
CD4 T Cell Abs: 320 /uL — ABNORMAL LOW (ref 400–2700)

## 2014-02-16 LAB — HIV-1 RNA QUANT-NO REFLEX-BLD
HIV 1 RNA Quant: <20 copies/mL (ref <20)
HIV-1 RNA Quant, Log: <1.3 {Log} (ref <1.30)

## 2014-02-18 ENCOUNTER — Ambulatory Visit: Payer: Medicare Other | Admitting: Internal Medicine

## 2014-02-18 LAB — HEPATITIS C RNA QUANTITATIVE: HCV Quantitative: NOT DETECTED IU/mL (ref <15)

## 2014-03-16 ENCOUNTER — Ambulatory Visit: Payer: Medicare Other | Admitting: Internal Medicine

## 2014-04-13 ENCOUNTER — Ambulatory Visit: Payer: Medicare Other | Admitting: Gastroenterology

## 2014-04-20 ENCOUNTER — Encounter: Payer: Self-pay | Admitting: Internal Medicine

## 2014-04-20 ENCOUNTER — Ambulatory Visit (INDEPENDENT_AMBULATORY_CARE_PROVIDER_SITE_OTHER): Payer: Medicare Other | Admitting: Internal Medicine

## 2014-04-20 VITALS — BP 132/78 | HR 52 | Temp 98.2°F | Wt 249.0 lb

## 2014-04-20 DIAGNOSIS — Z23 Encounter for immunization: Secondary | ICD-10-CM

## 2014-04-20 DIAGNOSIS — K7460 Unspecified cirrhosis of liver: Secondary | ICD-10-CM

## 2014-04-20 DIAGNOSIS — B182 Chronic viral hepatitis C: Secondary | ICD-10-CM

## 2014-04-20 DIAGNOSIS — B20 Human immunodeficiency virus [HIV] disease: Secondary | ICD-10-CM

## 2014-04-20 LAB — CBC WITH DIFFERENTIAL/PLATELET
Basophils Absolute: 0 10*3/uL (ref 0.0–0.1)
Basophils Relative: 0 % (ref 0–1)
Eosinophils Absolute: 0.1 10*3/uL (ref 0.0–0.7)
Eosinophils Relative: 2 % (ref 0–5)
HCT: 44.1 % (ref 39.0–52.0)
Hemoglobin: 15.7 g/dL (ref 13.0–17.0)
Lymphocytes Relative: 39 % (ref 12–46)
Lymphs Abs: 1.6 10*3/uL (ref 0.7–4.0)
MCH: 34.4 pg — ABNORMAL HIGH (ref 26.0–34.0)
MCHC: 35.6 g/dL (ref 30.0–36.0)
MCV: 96.5 fL (ref 78.0–100.0)
Monocytes Absolute: 0.3 10*3/uL (ref 0.1–1.0)
Monocytes Relative: 7 % (ref 3–12)
Neutro Abs: 2.2 10*3/uL (ref 1.7–7.7)
Neutrophils Relative %: 52 % (ref 43–77)
Platelets: 200 10*3/uL (ref 150–400)
RBC: 4.57 MIL/uL (ref 4.22–5.81)
RDW: 13.9 % (ref 11.5–15.5)
WBC: 4.2 10*3/uL (ref 4.0–10.5)

## 2014-04-20 NOTE — Assessment & Plan Note (Signed)
Labs today and rtc 3 months unless concerns 

## 2014-04-20 NOTE — Assessment & Plan Note (Signed)
He is going to call to reschedule GI appt.  I again described the issues with cirrhosis, varices and need to screen.  Also reiterated need to abstain completely from alcohol.

## 2014-04-20 NOTE — Assessment & Plan Note (Signed)
End of treatment SVR today.  RTC 3 months.

## 2014-04-20 NOTE — Addendum Note (Signed)
Addended by: Mariea Clonts D on: 04/20/2014 04:01 PM   Modules accepted: Orders

## 2014-04-20 NOTE — Progress Notes (Signed)
  Subjective:    Patient ID: Erik Frederick, male    DOB: August 19, 1955, 58 y.o.   MRN: [id number]  HPI He comes in for followup of his HIV and hepatitis C. He was changed to Tivicay and Truvada in anticipation of starting Harvoni and did start on 01/22/14. He has genotype 1b with initial viral RNA of [id number].  elastography F4.  His 4 week viral load was undetectable and he has now has 4 pills left to finish 12 weeks.  He missed his GI appt.      Review of Systems  Constitutional: Negative for fatigue.  HENT: Negative for sore throat.   Eyes: Negative for visual disturbance.  Respiratory: Negative for cough and shortness of breath.   Cardiovascular: Negative for leg swelling.  Gastrointestinal: Negative for nausea, vomiting, abdominal pain, diarrhea and abdominal distention.  Musculoskeletal: Negative for arthralgias.  Skin: Negative for rash.  Neurological: Negative for dizziness, light-headedness and headaches.  Hematological: Negative for adenopathy.  Psychiatric/Behavioral: Negative for dysphoric mood.       Objective:   Physical Exam  Constitutional: He appears well-developed and well-nourished. No distress.  HENT:  Mouth/Throat: No oropharyngeal exudate.  Eyes: No scleral icterus.  Cardiovascular: Normal rate, regular rhythm and normal heart sounds.   No murmur heard. Pulmonary/Chest: Effort normal and breath sounds normal. No respiratory distress. He has no wheezes.  Abdominal: Soft. Bowel sounds are normal. He exhibits no distension.  Musculoskeletal: He exhibits no edema.  Lymphadenopathy:    He has no cervical adenopathy.  Neurological: He is alert.  Skin: Skin is warm and dry. No rash noted.  Psychiatric: He has a normal mood and affect. His behavior is normal.          Assessment & Plan:

## 2014-04-21 LAB — COMPLETE METABOLIC PANEL WITH GFR
ALT: 35 U/L (ref 0–53)
AST: 29 U/L (ref 0–37)
Albumin: 4.2 g/dL (ref 3.5–5.2)
Alkaline Phosphatase: 96 U/L (ref 39–117)
BUN: 11 mg/dL (ref 6–23)
CO2: 27 mEq/L (ref 19–32)
Calcium: 9.3 mg/dL (ref 8.4–10.5)
Chloride: 104 mEq/L (ref 96–112)
Creat: 1.28 mg/dL (ref 0.50–1.35)
GFR, Est African American: 70 mL/min
GFR, Est Non African American: 61 mL/min
Glucose, Bld: 87 mg/dL (ref 70–99)
Potassium: 3.8 mEq/L (ref 3.5–5.3)
Sodium: 138 mEq/L (ref 135–145)
Total Bilirubin: 0.9 mg/dL (ref 0.2–1.2)
Total Protein: 7 g/dL (ref 6.0–8.3)

## 2014-04-21 LAB — HEPATITIS C RNA QUANTITATIVE: HCV Quantitative: NOT DETECTED IU/mL (ref <15)

## 2014-04-21 LAB — HIV-1 RNA QUANT-NO REFLEX-BLD
HIV 1 RNA Quant: <20 copies/mL (ref <20)
HIV-1 RNA Quant, Log: <1.3 {Log} (ref <1.30)

## 2014-04-21 LAB — T-HELPER CELL (CD4) - (RCID CLINIC ONLY)
CD4 % Helper T Cell: 20 % — ABNORMAL LOW (ref 33–55)
CD4 T Cell Abs: 310 /uL — ABNORMAL LOW (ref 400–2700)

## 2014-06-23 ENCOUNTER — Ambulatory Visit: Payer: Medicare Other | Admitting: Gastroenterology

## 2014-07-20 ENCOUNTER — Telehealth: Payer: Self-pay | Admitting: *Deleted

## 2014-07-20 ENCOUNTER — Ambulatory Visit: Payer: Medicare Other | Admitting: Internal Medicine

## 2014-07-20 NOTE — Telephone Encounter (Signed)
Called patient to notify of missed visit. Patient states he forgot.  Patient rescheduled for 12/15 1:45. Andree CossHowell, Sherra Kimmons M, RN

## 2014-07-27 ENCOUNTER — Encounter: Payer: Self-pay | Admitting: Internal Medicine

## 2014-07-27 ENCOUNTER — Other Ambulatory Visit (HOSPITAL_COMMUNITY)
Admission: RE | Admit: 2014-07-27 | Discharge: 2014-07-27 | Disposition: A | Discharge status: Home / Self Care | Payer: Medicare Other | Source: Ambulatory Visit | Attending: Internal Medicine | Admitting: Internal Medicine

## 2014-07-27 ENCOUNTER — Ambulatory Visit (INDEPENDENT_AMBULATORY_CARE_PROVIDER_SITE_OTHER): Payer: Medicare Other | Admitting: Internal Medicine

## 2014-07-27 VITALS — BP 165/77 | HR 58 | Temp 98.1°F | Ht 71.0 in | Wt 255.0 lb

## 2014-07-27 DIAGNOSIS — Z113 Encounter for screening for infections with a predominantly sexual mode of transmission: Secondary | ICD-10-CM | POA: Diagnosis present

## 2014-07-27 DIAGNOSIS — Z79899 Other long term (current) drug therapy: Secondary | ICD-10-CM

## 2014-07-27 DIAGNOSIS — B20 Human immunodeficiency virus [HIV] disease: Secondary | ICD-10-CM

## 2014-07-27 DIAGNOSIS — K7460 Unspecified cirrhosis of liver: Secondary | ICD-10-CM

## 2014-07-27 DIAGNOSIS — Z23 Encounter for immunization: Secondary | ICD-10-CM

## 2014-07-27 DIAGNOSIS — B182 Chronic viral hepatitis C: Secondary | ICD-10-CM

## 2014-07-27 LAB — COMPLETE METABOLIC PANEL WITH GFR
ALT: 28 U/L (ref 0–53)
AST: 34 U/L (ref 0–37)
Albumin: 3.9 g/dL (ref 3.5–5.2)
Alkaline Phosphatase: 100 U/L (ref 39–117)
BUN: 10 mg/dL (ref 6–23)
CO2: 26 mEq/L (ref 19–32)
Calcium: 9 mg/dL (ref 8.4–10.5)
Chloride: 104 mEq/L (ref 96–112)
Creat: 1.19 mg/dL (ref 0.50–1.35)
GFR, Est African American: 77 mL/min
GFR, Est Non African American: 66 mL/min
Glucose, Bld: 90 mg/dL (ref 70–99)
Potassium: 4 mEq/L (ref 3.5–5.3)
Sodium: 139 mEq/L (ref 135–145)
Total Bilirubin: 0.5 mg/dL (ref 0.2–1.2)
Total Protein: 7 g/dL (ref 6.0–8.3)

## 2014-07-27 LAB — CBC WITH DIFFERENTIAL/PLATELET
Basophils Absolute: 0 10*3/uL (ref 0.0–0.1)
Basophils Relative: 1 % (ref 0–1)
Eosinophils Absolute: 0.1 10*3/uL (ref 0.0–0.7)
Eosinophils Relative: 2 % (ref 0–5)
HCT: 43.6 % (ref 39.0–52.0)
Hemoglobin: 15.8 g/dL (ref 13.0–17.0)
Lymphocytes Relative: 39 % (ref 12–46)
Lymphs Abs: 1.4 10*3/uL (ref 0.7–4.0)
MCH: 34.2 pg — ABNORMAL HIGH (ref 26.0–34.0)
MCHC: 36.2 g/dL — ABNORMAL HIGH (ref 30.0–36.0)
MCV: 94.4 fL (ref 78.0–100.0)
MPV: 8.8 fL — ABNORMAL LOW (ref 9.4–12.4)
Monocytes Absolute: 0.3 10*3/uL (ref 0.1–1.0)
Monocytes Relative: 8 % (ref 3–12)
Neutro Abs: 1.8 10*3/uL (ref 1.7–7.7)
Neutrophils Relative %: 50 % (ref 43–77)
Platelets: 204 10*3/uL (ref 150–400)
RBC: 4.62 MIL/uL (ref 4.22–5.81)
RDW: 14.4 % (ref 11.5–15.5)
WBC: 3.5 10*3/uL — ABNORMAL LOW (ref 4.0–10.5)

## 2014-07-27 LAB — LIPID PANEL
Cholesterol: 170 mg/dL (ref 0–200)
HDL: 52 mg/dL (ref >39)
LDL Cholesterol: 94 mg/dL (ref 0–99)
Total CHOL/HDL Ratio: 3.3 Ratio
Triglycerides: 121 mg/dL (ref <150)
VLDL: 24 mg/dL (ref 0–40)

## 2014-07-27 NOTE — Assessment & Plan Note (Signed)
He again is going to call to get scheduled with gastroenterology for screening varices. I again discussed the procedure with him to the best of my ability and deferred other questions to gastroenterology. He is going to call again to reschedule. I discussed the need for monitoring of his liver. I'm going to get his 6 month screening ultrasound of [facility]. I will then have it repeated in 6 months when he returns.

## 2014-07-27 NOTE — Assessment & Plan Note (Signed)
He will get SVR 12 today and if it remains undetectable he will be considered cured. If there are any concerns, he will be notified.

## 2014-07-27 NOTE — Progress Notes (Signed)
  Subjective:    Patient ID: Erik Frederick, male    DOB: 04-13-1956, 58 y.o.   MRN: [id number]  HPI He comes in for followup of his HIV and hepatitis C. He was changed to Tivicay and Truvada in anticipation of starting Harvoni and did start on 01/22/14. He has genotype 1b with initial viral RNA of [id number].  elastography F4.  [facility] treatment and [facility].  Did not see GI again.  Was nervous about getting EGD, though thought it was like an NG tube.  Still struggles with drinking.       Review of Systems  Constitutional: Negative for fatigue.  HENT: Negative for sore throat.   Eyes: Negative for visual disturbance.  Respiratory: Negative for cough and shortness of breath.   Cardiovascular: Negative for leg swelling.  Gastrointestinal: Negative for nausea, vomiting, abdominal pain, diarrhea and abdominal distention.  Musculoskeletal: Negative for arthralgias.  Skin: Negative for rash.  Neurological: Negative for dizziness, light-headedness and headaches.  Hematological: Negative for adenopathy.  Psychiatric/Behavioral: Negative for dysphoric mood.       Objective:   Physical Exam  Constitutional: He appears well-developed and well-nourished. No distress.  HENT:  Mouth/Throat: No oropharyngeal exudate.  Eyes: No scleral icterus.  Cardiovascular: Normal rate, regular rhythm and normal heart sounds.   No murmur heard. Pulmonary/Chest: Effort normal and breath sounds normal. No respiratory distress.  Lymphadenopathy:    He has no cervical adenopathy.  Skin: No rash noted.          Assessment & Plan:

## 2014-07-27 NOTE — Assessment & Plan Note (Signed)
He is doing well and will remain on his current regimen. I will get his labs today and if reassuring he can return in 6 months.

## 2014-07-28 ENCOUNTER — Encounter: Payer: Self-pay | Admitting: Internal Medicine

## 2014-07-28 LAB — HEPATITIS C RNA QUANTITATIVE: HCV Quantitative: NOT DETECTED IU/mL (ref <15)

## 2014-07-28 LAB — HIV-1 RNA QUANT-NO REFLEX-BLD
HIV 1 RNA Quant: <20 copies/mL (ref <20)
HIV-1 RNA Quant, Log: <1.3 {Log} (ref <1.30)

## 2014-07-28 LAB — RPR

## 2014-07-28 LAB — T-HELPER CELL (CD4) - (RCID CLINIC ONLY)
CD4 % Helper T Cell: 23 % — ABNORMAL LOW (ref 33–55)
CD4 T Cell Abs: 330 /uL — ABNORMAL LOW (ref 400–2700)

## 2014-07-28 NOTE — Addendum Note (Signed)
Addended by: Andree CossHOWELL, Metta Koranda M on: 07/28/2014 12:37 PM   Modules accepted: Orders

## 2014-07-29 LAB — URINE CYTOLOGY ANCILLARY ONLY
Chlamydia: NEGATIVE
Neisseria Gonorrhea: NEGATIVE

## 2014-08-19 ENCOUNTER — Ambulatory Visit (HOSPITAL_COMMUNITY): Payer: Medicare Other

## 2014-08-25 ENCOUNTER — Other Ambulatory Visit: Payer: Self-pay | Admitting: Internal Medicine

## 2014-08-25 DIAGNOSIS — B20 Human immunodeficiency virus [HIV] disease: Secondary | ICD-10-CM

## 2014-08-25 MED ORDER — EMTRICITABINE-TENOFOVIR DF 200-300 MG PO TABS: 1.0000 | ORAL_TABLET | Freq: Every day | ORAL | Status: DC

## 2014-08-30 ENCOUNTER — Ambulatory Visit: Payer: Medicare Other

## 2014-09-10 ENCOUNTER — Other Ambulatory Visit: Payer: Self-pay | Admitting: *Deleted

## 2014-09-10 DIAGNOSIS — B20 Human immunodeficiency virus [HIV] disease: Secondary | ICD-10-CM

## 2014-09-10 MED ORDER — DOLUTEGRAVIR SODIUM 50 MG PO TABS: 50.0000 mg | ORAL_TABLET | Freq: Every day | ORAL | Status: DC

## 2014-09-10 MED ORDER — EMTRICITABINE-TENOFOVIR DF 200-300 MG PO TABS: 1.0000 | ORAL_TABLET | Freq: Every day | ORAL | Status: DC

## 2014-09-23 ENCOUNTER — Other Ambulatory Visit: Payer: Self-pay | Admitting: *Deleted

## 2014-09-23 DIAGNOSIS — B182 Chronic viral hepatitis C: Secondary | ICD-10-CM

## 2014-09-23 DIAGNOSIS — Z8619 Personal history of other infectious and parasitic diseases: Secondary | ICD-10-CM

## 2014-09-23 DIAGNOSIS — K7460 Unspecified cirrhosis of liver: Secondary | ICD-10-CM

## 2014-09-30 ENCOUNTER — Other Ambulatory Visit: Payer: Self-pay | Admitting: Internal Medicine

## 2014-09-30 DIAGNOSIS — K7460 Unspecified cirrhosis of liver: Secondary | ICD-10-CM

## 2014-10-07 ENCOUNTER — Telehealth: Payer: Self-pay | Admitting: *Deleted

## 2014-10-07 ENCOUNTER — Encounter: Payer: Self-pay | Admitting: Physician Assistant

## 2014-10-07 NOTE — Telephone Encounter (Signed)
Erik Frederick, took us 3 tries and a phone call to refer this patient to your office for cirrhosis.  Referral kept getting denied by your office due to the hep C diagnosis even though it was sent as a cirrhosis referral.  If you could let them know, thanks.  Thanks,  Rob

## 2014-10-07 NOTE — Telephone Encounter (Signed)
We will work (again) on improving this.

## 2014-10-07 NOTE — Telephone Encounter (Signed)
Scheduled patient for follow up at [facility], explained that we were not referring patient for Hepatitis C, but for cirrhosis.  Patient scheduled 3/4 10:00 with Amy, PA.  [facility] will mail patient a packet containing new patient information/forms to be filled out.  RN notified patient, he accepted the appointment. Gave him the phone number and location. Andree CossHowell, Mindi Akerson M, RN

## 2014-10-15 ENCOUNTER — Other Ambulatory Visit (INDEPENDENT_AMBULATORY_CARE_PROVIDER_SITE_OTHER): Payer: Medicare Other

## 2014-10-15 ENCOUNTER — Encounter: Payer: Self-pay | Admitting: Physician Assistant

## 2014-10-15 ENCOUNTER — Ambulatory Visit (INDEPENDENT_AMBULATORY_CARE_PROVIDER_SITE_OTHER): Payer: Medicare Other | Admitting: Physician Assistant

## 2014-10-15 VITALS — BP 142/88 | HR 56 | Ht 70.5 in | Wt 256.5 lb

## 2014-10-15 DIAGNOSIS — K7469 Other cirrhosis of liver: Secondary | ICD-10-CM

## 2014-10-15 DIAGNOSIS — B20 Human immunodeficiency virus [HIV] disease: Secondary | ICD-10-CM

## 2014-10-15 LAB — PROTIME-INR
INR: 1.1 ratio — ABNORMAL HIGH (ref 0.8–1.0)
Prothrombin Time: 11.9 s (ref 9.6–13.1)

## 2014-10-15 LAB — HEPATITIS B SURFACE ANTIGEN: Hepatitis B Surface Ag: NEGATIVE

## 2014-10-15 LAB — HEPATITIS B SURFACE ANTIBODY,QUALITATIVE: Hep B S Ab: POSITIVE — AB

## 2014-10-15 NOTE — Progress Notes (Signed)
Patient ID: Erik Frederick, male   DOB: 1955/10/24, 59 y.o.   MRN: [id number]   Subjective:    Patient ID: Erik Frederick, male    DOB: December 03, 1955, 59 y.o.   MRN: [id number]  HPI Erik Frederick  is a pleasant 59 year old African-American male referred today by Dr. Luciana Axe / Infectious disease for establishment of care for cirrhosis. Patient is known remotely to Dr. Jarold Motto and had undergone colonoscopy in 2011 for screening. This was a normal exam. Patient has history of HIV for which she is on treatment and had hepatitis C he was treated with Harvoni , and last viral load done in December was undetectable. Patient has prior history of drug use which is been in active long-term. He does continue to drink some alcohol. Patient had hepatic elastography done in April 2015 and had a fibrosis score of F4. Routine upper abdominal ultrasound in January 2015 was read as normal. Most recent labs 12th 2015 do BBC of 3.5 hemoglobin 15.8 platelets 204 , CMET entirely normal albumin 3.9. Dr. Luciana Axe  had suggested to the patient previously noted he needed GI follow-up and upper endoscopy to screen for varices. He had been reluctant to come previously. Patient has no current GI symptoms. He says he feels well. Appetite is been fine and weight has been stable no complaints of abdominal pain or changes in bowel habits melena or hematochezia.  Review of Systems Pertinent positive and negative review of systems were noted in the above HPI section.  All other review of systems was otherwise negative.  Outpatient Encounter Prescriptions as of 10/15/2014  Medication Sig  . carvedilol (COREG) 12.5 MG tablet Take 12.5 mg by mouth 2 (two) times daily with a meal.  . dolutegravir (TIVICAY) 50 MG tablet Take 1 tablet (50 mg total) by mouth daily.  Marland Kitchen emtricitabine-tenofovir (TRUVADA) 200-300 MG per tablet Take 1 tablet by mouth daily.  Marland Kitchen FLUoxetine (PROZAC) 40 MG capsule Take 1 capsule (40 mg total) by mouth daily.  . Loperamide HCl  (IMODIUM A-D PO) Take by mouth. Takes when has to go to work, approx twice weekly  . LORazepam (ATIVAN) 1 MG tablet 1-2 tablets by mouth at bedtime as needed for sleep.  . Multiple Vitamin (MULTIVITAMIN) tablet Take 1 tablet by mouth daily.  Marland Kitchen triamterene-hydrochlorothiazide (DYAZIDE) 37.5-25 MG per capsule Take 1 each (1 capsule total) by mouth daily.   No Known Allergies Patient Active Problem List   Diagnosis Date Noted  . Screening examination for venereal disease 07/27/2014  . Encounter for long-term (current) use of medications 07/27/2014  . Hepatic cirrhosis 02/02/2014  . Hepatitis C virus infection without hepatic coma 05/12/2013  . Tobacco abuse 05/12/2013  . Diarrhea 12/30/2012  . Insomnia 02/23/2011  . OSTEOARTHRITIS, GENERALIZED 09/11/2010  . HAND PAIN, LEFT 11/14/2009  . PERIPHERAL EDEMA 10/20/2009  . Human immunodeficiency virus (HIV) disease 03/24/2007  . GERD 03/24/2007  . CHOLELITHIASIS 03/24/2007   History   Social History  . Marital Status: Legally Separated    Spouse Name: N/A  . Number of Children: 4  . Years of Education: N/A   Occupational History  . retired    Social History Main Topics  . Smoking status: Current Every Day Smoker -- 0.20 packs/day    Types: Cigarettes    Start date: 01/11/2013  . Smokeless tobacco: Never Used  . Alcohol Use: 7.2 oz/week    12 Cans of beer per week     Comment: beer  . Drug Use: No  .  Sexual Activity:    Partners: Female    CopyBirth Control/ Protection: Condom     Comment: pt. given condoms   Other Topics Concern  . Not on file   Social History Narrative    [name]'s family history includes Alzheimer's disease in his father; Breast cancer in his sister; Diabetes (age of onset: 2170) in his mother; Gallstones in his father; Heart disease in his mother.      Objective:    Filed Vitals:   10/15/14 1014  BP: 142/88  Pulse: 56    Physical Exam   well-developed African-American male in no acute distress,  pleasant blood pressure 142/88 pulse 56 height 5 foot 10 weight 256. HEENT; nontraumatic normocephalic EOMI PERRLA sclera anicteric, Supple ;no JVD, Cardiovascular ;regular rate and rhythm with S1-S2 no murmur or gallop, Pulmonary; clear bilaterally, Abdomen ;soft nontender nondistended bowel sounds are present there is no palpable mass or hepatosplenomegaly no fluid wave am not done, Extremities ;no clubbing cyanosis or edema skin warm and dry, Psych ;mood and affect appropriate       Assessment & Plan:   #1 59 yo male with Compensated cirrhosis secondary to Hep C /ETOH. Pt has been treated with Harvoni for Hep C genotype 1b and cured #2 HIV #3 cholelithiasis  #5 colon screening- negative colonoscopy 2011, average risk  Plan; Will check protime, AFP Schedule upper abdominal  US  Schedule for EGD  With Dr Christella HartiganJacobs to assess for varices.Procedure discussed in detail with pt and he is agreeable to proceed.  Discussion was had with patient today regarding diagnosis of cirrhosis which appears well compensated at this time. He is advised that complete abstinence from alcohol was in his best interest long-term.   Toniette Devera Oswald HillockS Nikitta Sobiech PA-C 10/15/2014   Cc: Gardiner Barefootomer, Robert W, MD

## 2014-10-15 NOTE — Patient Instructions (Signed)
Please go to the basement level to have your labs drawn.  You have been [facility] of hospital) on 10-21-2014 at 8:00 am . Please arrive at 7:45 am  to your appointment for registration. Make certain not to have anything to eat or drink after midnight.  Should you need to reschedule your appointment, please contact radiology at [phone]. This test typically takes about 30 minutes to perform.  You have been scheduled for an endoscopy. Please follow written instructions given to you at your visit today. If you use inhalers (even only as needed), please bring them with you on the day of your procedure. Your physician has requested that you go to www.startemmi.com and enter the access code given to you at your visit today. This web site gives a general overview about your procedure. However, you should still follow specific instructions given to you by our office regarding your preparation for the procedure.

## 2014-10-15 NOTE — Progress Notes (Signed)
I agree with the above note, plan 

## 2014-10-16 LAB — AFP TUMOR MARKER: AFP-Tumor Marker: 7.4 ng/mL — ABNORMAL HIGH (ref <6.1)

## 2014-10-21 ENCOUNTER — Ambulatory Visit (HOSPITAL_COMMUNITY): Admission: RE | Admit: 2014-10-21 | Payer: Medicare Other | Source: Ambulatory Visit

## 2014-10-22 ENCOUNTER — Ambulatory Visit (HOSPITAL_COMMUNITY)
Admission: RE | Admit: 2014-10-22 | Discharge: 2014-10-22 | Disposition: A | Discharge status: Home / Self Care | Payer: Medicare Other | Source: Ambulatory Visit | Attending: Physician Assistant | Admitting: Physician Assistant

## 2014-10-22 DIAGNOSIS — K7469 Other cirrhosis of liver: Secondary | ICD-10-CM

## 2014-10-22 DIAGNOSIS — K7460 Unspecified cirrhosis of liver: Secondary | ICD-10-CM | POA: Diagnosis not present

## 2014-10-22 DIAGNOSIS — B1920 Unspecified viral hepatitis C without hepatic coma: Secondary | ICD-10-CM | POA: Insufficient documentation

## 2014-10-27 ENCOUNTER — Encounter: Payer: Self-pay | Admitting: Gastroenterology

## 2014-11-23 ENCOUNTER — Ambulatory Visit (AMBULATORY_SURGERY_CENTER): Payer: Medicare Other | Admitting: Gastroenterology

## 2014-11-23 ENCOUNTER — Encounter: Payer: Self-pay | Admitting: Gastroenterology

## 2014-11-23 VITALS — BP 141/79 | HR 54 | Temp 96.8°F | Resp 17 | Ht 70.0 in | Wt 256.0 lb

## 2014-11-23 DIAGNOSIS — I8510 Secondary esophageal varices without bleeding: Secondary | ICD-10-CM

## 2014-11-23 DIAGNOSIS — K7460 Unspecified cirrhosis of liver: Secondary | ICD-10-CM

## 2014-11-23 DIAGNOSIS — K2970 Gastritis, unspecified, without bleeding: Secondary | ICD-10-CM

## 2014-11-23 DIAGNOSIS — K7469 Other cirrhosis of liver: Secondary | ICD-10-CM | POA: Diagnosis present

## 2014-11-23 DIAGNOSIS — K319 Disease of stomach and duodenum, unspecified: Secondary | ICD-10-CM | POA: Diagnosis not present

## 2014-11-23 MED ORDER — SODIUM CHLORIDE 0.9 % IV SOLN: 500.0000 mL | INTRAVENOUS | Status: DC

## 2014-11-23 NOTE — Patient Instructions (Addendum)
One of your biggest health concerns is your smoking.  This increases your risk for most cancers and serious cardiovascular diseases such as strokes, heart attacks.  You should try your best to stop.  If you need assistance, please contact your PCP or Smoking Cessation Class at [facility] [phone]) or [facility]  Quit-Line (1-800-QUIT-NOW). You should try to quite drinking alcohol since you have cirrhosis.  Discharge instructions given. Biopsies taken. Resume previous medications. YOU HAD AN ENDOSCOPIC PROCEDURE TODAY AT THE [facility]:   Refer to the procedure report that was given to you for any specific questions about what was found during the examination.  If the procedure report does not answer your questions, please call your gastroenterologist to clarify.  If you requested that your care partner not be given the details of your procedure findings, then the procedure report has been included in a sealed envelope for you to review at your convenience later.  YOU SHOULD EXPECT: Some feelings of bloating in the abdomen. Passage of more gas than usual.  Walking can help get rid of the air that was put into your GI tract during the procedure and reduce the bloating. If you had a lower endoscopy (such as a colonoscopy or flexible sigmoidoscopy) you may notice spotting of blood in your stool or on the toilet paper. If you underwent a bowel prep for your procedure, you may not have a normal bowel movement for a few days.  Please Note:  You might notice some irritation and congestion in your nose or some drainage.  This is from the oxygen used during your procedure.  There is no need for concern and it should clear up in a day or so.  SYMPTOMS TO REPORT IMMEDIATELY:    Following upper endoscopy (EGD)  Vomiting of blood or coffee ground material  New chest pain or pain under the shoulder blades  Painful or persistently difficult swallowing  New shortness of breath  Fever of  100F or higher  Black, tarry-looking stools  For urgent or emergent issues, a gastroenterologist can be reached at any hour by calling [phone].   DIET: Your first meal following the procedure should be a small meal and then it is ok to progress to your normal diet. Heavy or fried foods are harder to digest and may make you feel nauseous or bloated.  Likewise, meals heavy in dairy and vegetables can increase bloating.  Drink plenty of fluids but you should avoid alcoholic beverages for 24 hours.  ACTIVITY:  You should plan to take it easy for the rest of today and you should NOT DRIVE or use heavy machinery until tomorrow (because of the sedation medicines used during the test).    FOLLOW UP: Our staff will call the number listed on your records the next business day following your procedure to check on you and address any questions or concerns that you may have regarding the information given to you following your procedure. If we do not reach you, we will leave a message.  However, if you are feeling well and you are not experiencing any problems, there is no need to return our call.  We will assume that you have returned to your regular daily activities without incident.  If any biopsies were taken you will be contacted by phone or by letter within the next 1-3 weeks.  Please call us at [phone] if you have not heard about the biopsies in 3 weeks.    SIGNATURES/CONFIDENTIALITY: You  and/or your care partner have signed paperwork which will be entered into your electronic medical record.  These signatures attest to the fact that that the information above on your After Visit Summary has been reviewed and is understood.  Full responsibility of the confidentiality of this discharge information lies with you and/or your care-partner.

## 2014-11-23 NOTE — Op Note (Signed)
[facility] [address] [place], [id number]   ENDOSCOPY PROCEDURE REPORT  PATIENT: Erik Frederick, [name]  MR#: #[id number] BIRTHDATE: 04-18-1956 , 59  yrs. old GENDER: male ENDOSCOPIST: Rachael Feeaniel P Conrad Zajkowski, MD PROCEDURE DATE:  11/23/2014 PROCEDURE:  EGD w/ biopsy ASA CLASS:     Class III INDICATIONS:  HIV/Hep C/Etoh related cirrhosis; variceal screening.  MEDICATIONS: Monitored anesthesia care and Propofol 240 mg IV TOPICAL ANESTHETIC: none  DESCRIPTION OF PROCEDURE: After the risks benefits and alternatives of the procedure were thoroughly explained, informed consent was obtained.  The LB [id number] endoscope was introduced through the mouth and advanced to the second portion of the duodenum , Without limitations.  The instrument was slowly withdrawn as the mucosa was fully examined.  There was mild to moderate non-specific distal gastritis.  This was biopsied and sent to pathology.  There were small distal esophageal varices and also mild reflux associated esophagitis.  There were no gastric varices or portal gastropathy.  Retroflexed views revealed no abnormalities.     The scope was then withdrawn from the patient and the procedure completed. COMPLICATIONS: There were no immediate complications.  ENDOSCOPIC IMPRESSION: There was mild to moderate non-specific distal gastritis.  This was biopsied and sent to pathology.  There were small distal esophageal varices and also mild reflux associated esophagitis.  There were no gastric varices or portal gastropathy  RECOMMENDATIONS: My office will call to schedule return visit in 2 months. You should try to stop drinking alcohol since you have cirrhosis. Given low resting heart rate (50s-65), will not start non-selective B blocker.    eSigned:  Rachael Feeaniel P Jin Shockley, MD 11/23/2014 3:22 PM    CC: Staci Righterobert Comer, MD

## 2014-11-23 NOTE — Progress Notes (Signed)
Called to room to assist during endoscopic procedure.  Patient ID and intended procedure confirmed with present staff. Received instructions for my participation in the procedure from the performing physician.  

## 2014-11-23 NOTE — Progress Notes (Signed)
Report to PACU, RN, vss, BBS= Clear.  

## 2014-11-24 ENCOUNTER — Telehealth: Payer: Self-pay | Admitting: *Deleted

## 2014-11-24 NOTE — Telephone Encounter (Signed)
  Follow up Call-  Call back number 11/23/2014  Post procedure Call Back phone  # [phone]  Permission to leave phone message Yes     Patient questions:  Message left to call us if necessary.

## 2014-12-02 ENCOUNTER — Telehealth: Payer: Self-pay | Admitting: Gastroenterology

## 2014-12-02 ENCOUNTER — Encounter: Payer: Self-pay | Admitting: Gastroenterology

## 2014-12-02 DIAGNOSIS — K921 Melena: Secondary | ICD-10-CM

## 2014-12-02 NOTE — Telephone Encounter (Signed)
Pt has a history of cirrhosis and is calling to report black stools.  He had an EGD done 11/2014 see impression:   ENDOSCOPIC IMPRESSION: There was mild to moderate non-specific distal gastritis. This was biopsied and sent to pathology. There were small distal esophageal varices and also mild reflux associated esophagitis. There were no gastric varices or portal gastropathy  Dr Christella HartiganJacobs do you want me to get him in the office with extender for eval of possible varices?

## 2014-12-02 NOTE — Telephone Encounter (Signed)
Lets get a cbc, inr, cmet today

## 2014-12-02 NOTE — Telephone Encounter (Signed)
Labs have been added to EPIC message left for pt to return call

## 2014-12-03 NOTE — Telephone Encounter (Signed)
Left message on machine to call back  

## 2014-12-07 NOTE — Telephone Encounter (Signed)
Unable to reach the pt I have left several messages.  Letter to be mailed

## 2014-12-14 ENCOUNTER — Telehealth: Payer: Self-pay | Admitting: Gastroenterology

## 2014-12-15 NOTE — Telephone Encounter (Signed)
Pt complains of black stools with every bowel movement.  I did advise him that Dr Christella HartiganJacobs would like for him to come in for labs and the pt agreed to come in later today.  He will be called with results and recommendations as soon as available.

## 2015-01-26 ENCOUNTER — Ambulatory Visit: Payer: Medicare Other | Admitting: Internal Medicine

## 2015-04-09 ENCOUNTER — Other Ambulatory Visit: Payer: Self-pay | Admitting: Internal Medicine

## 2015-04-14 ENCOUNTER — Other Ambulatory Visit: Payer: Self-pay | Admitting: *Deleted

## 2015-04-14 DIAGNOSIS — B20 Human immunodeficiency virus [HIV] disease: Secondary | ICD-10-CM

## 2015-04-14 MED ORDER — DOLUTEGRAVIR SODIUM 50 MG PO TABS: 50.0000 mg | ORAL_TABLET | Freq: Every day | ORAL | Status: DC

## 2015-04-14 MED ORDER — EMTRICITABINE-TENOFOVIR DF 200-300 MG PO TABS: 1.0000 | ORAL_TABLET | Freq: Every day | ORAL | Status: DC

## 2015-06-18 ENCOUNTER — Other Ambulatory Visit: Payer: Self-pay | Admitting: Internal Medicine

## 2015-07-28 ENCOUNTER — Other Ambulatory Visit: Payer: Medicare Other

## 2015-08-01 ENCOUNTER — Other Ambulatory Visit: Payer: Self-pay | Admitting: *Deleted

## 2015-08-01 ENCOUNTER — Other Ambulatory Visit: Payer: Medicare Other

## 2015-08-01 DIAGNOSIS — B20 Human immunodeficiency virus [HIV] disease: Secondary | ICD-10-CM

## 2015-08-01 DIAGNOSIS — Z79899 Other long term (current) drug therapy: Secondary | ICD-10-CM

## 2015-08-01 DIAGNOSIS — Z113 Encounter for screening for infections with a predominantly sexual mode of transmission: Secondary | ICD-10-CM

## 2015-08-05 ENCOUNTER — Other Ambulatory Visit: Payer: Self-pay | Admitting: Internal Medicine

## 2015-08-25 ENCOUNTER — Ambulatory Visit: Payer: Medicare Other | Admitting: Internal Medicine

## 2015-09-10 ENCOUNTER — Other Ambulatory Visit: Payer: Self-pay | Admitting: Internal Medicine

## 2015-09-17 ENCOUNTER — Other Ambulatory Visit: Payer: Self-pay | Admitting: Internal Medicine

## 2015-09-19 ENCOUNTER — Other Ambulatory Visit: Payer: Self-pay | Admitting: Internal Medicine

## 2015-09-26 ENCOUNTER — Telehealth: Payer: Self-pay | Admitting: *Deleted

## 2015-09-26 DIAGNOSIS — B20 Human immunodeficiency virus [HIV] disease: Secondary | ICD-10-CM

## 2015-09-26 MED ORDER — DOLUTEGRAVIR SODIUM 50 MG PO TABS: ORAL_TABLET | ORAL | Status: DC

## 2015-09-26 MED ORDER — EMTRICITABINE-TENOFOVIR DF 200-300 MG PO TABS: 1.0000 | ORAL_TABLET | Freq: Every day | ORAL | Status: DC

## 2015-09-26 NOTE — Telephone Encounter (Signed)
Refill until appt in April.

## 2015-11-07 ENCOUNTER — Ambulatory Visit: Payer: Medicare Other

## 2015-11-07 ENCOUNTER — Other Ambulatory Visit (HOSPITAL_COMMUNITY)
Admission: RE | Admit: 2015-11-07 | Discharge: 2015-11-07 | Disposition: A | Discharge status: Home / Self Care | Payer: Medicare Other | Source: Ambulatory Visit | Attending: Internal Medicine | Admitting: Internal Medicine

## 2015-11-07 ENCOUNTER — Other Ambulatory Visit: Payer: Medicare Other

## 2015-11-07 DIAGNOSIS — Z113 Encounter for screening for infections with a predominantly sexual mode of transmission: Secondary | ICD-10-CM | POA: Insufficient documentation

## 2015-11-07 DIAGNOSIS — B20 Human immunodeficiency virus [HIV] disease: Secondary | ICD-10-CM

## 2015-11-07 LAB — COMPLETE METABOLIC PANEL WITH GFR
ALT: 37 U/L (ref 9–46)
AST: 35 U/L (ref 10–35)
Albumin: 4.4 g/dL (ref 3.6–5.1)
Alkaline Phosphatase: 101 U/L (ref 40–115)
BUN: 10 mg/dL (ref 7–25)
CO2: 29 mmol/L (ref 20–31)
Calcium: 8.9 mg/dL (ref 8.6–10.3)
Chloride: 102 mmol/L (ref 98–110)
Creat: 1.28 mg/dL — ABNORMAL HIGH (ref 0.70–1.25)
GFR, Est African American: 69 mL/min (ref >=60)
GFR, Est Non African American: 60 mL/min (ref >=60)
Glucose, Bld: 92 mg/dL (ref 65–99)
Potassium: 4.1 mmol/L (ref 3.5–5.3)
Sodium: 140 mmol/L (ref 135–146)
Total Bilirubin: 0.8 mg/dL (ref 0.2–1.2)
Total Protein: 7.3 g/dL (ref 6.1–8.1)

## 2015-11-07 LAB — CBC WITH DIFFERENTIAL/PLATELET
Basophils Absolute: 0 10*3/uL (ref 0.0–0.1)
Basophils Relative: 1 % (ref 0–1)
Eosinophils Absolute: 0.1 10*3/uL (ref 0.0–0.7)
Eosinophils Relative: 2 % (ref 0–5)
HCT: 44.8 % (ref 39.0–52.0)
Hemoglobin: 15.8 g/dL (ref 13.0–17.0)
Lymphocytes Relative: 39 % (ref 12–46)
Lymphs Abs: 1.7 10*3/uL (ref 0.7–4.0)
MCH: 33.9 pg (ref 26.0–34.0)
MCHC: 35.3 g/dL (ref 30.0–36.0)
MCV: 96.1 fL (ref 78.0–100.0)
MPV: 9.3 fL (ref 8.6–12.4)
Monocytes Absolute: 0.3 10*3/uL (ref 0.1–1.0)
Monocytes Relative: 7 % (ref 3–12)
Neutro Abs: 2.2 10*3/uL (ref 1.7–7.7)
Neutrophils Relative %: 51 % (ref 43–77)
Platelets: 213 10*3/uL (ref 150–400)
RBC: 4.66 MIL/uL (ref 4.22–5.81)
RDW: 13.9 % (ref 11.5–15.5)
WBC: 4.3 10*3/uL (ref 4.0–10.5)

## 2015-11-07 NOTE — Addendum Note (Signed)
Addended by: Mariea ClontsGREEN, Janes Colegrove D on: 11/07/2015 05:11 PM   Modules accepted: Orders

## 2015-11-08 LAB — T-HELPER CELL (CD4) - (RCID CLINIC ONLY)
CD4 % Helper T Cell: 24 % — ABNORMAL LOW (ref 33–55)
CD4 T Cell Abs: 380 /uL — ABNORMAL LOW (ref 400–2700)

## 2015-11-08 LAB — RPR

## 2015-11-08 LAB — HIV-1 RNA QUANT-NO REFLEX-BLD
HIV 1 RNA Quant: <20 copies/mL (ref <20)
HIV-1 RNA Quant, Log: <1.3 Log copies/mL (ref <1.30)

## 2015-11-09 LAB — URINE CYTOLOGY ANCILLARY ONLY
Chlamydia: NEGATIVE
Neisseria Gonorrhea: NEGATIVE

## 2015-11-15 ENCOUNTER — Other Ambulatory Visit: Payer: Medicare Other

## 2015-12-06 ENCOUNTER — Ambulatory Visit: Payer: Medicare Other | Admitting: Internal Medicine

## 2016-01-03 ENCOUNTER — Ambulatory Visit: Payer: Medicare Other | Admitting: Internal Medicine

## 2016-01-11 ENCOUNTER — Encounter: Payer: Self-pay | Admitting: Internal Medicine

## 2016-02-11 ENCOUNTER — Other Ambulatory Visit: Payer: Self-pay | Admitting: Internal Medicine

## 2016-02-27 ENCOUNTER — Other Ambulatory Visit: Payer: Self-pay | Admitting: Internal Medicine

## 2016-03-06 ENCOUNTER — Other Ambulatory Visit: Payer: Self-pay | Admitting: Internal Medicine

## 2016-03-06 ENCOUNTER — Other Ambulatory Visit: Payer: Medicare Other

## 2016-03-06 DIAGNOSIS — B20 Human immunodeficiency virus [HIV] disease: Secondary | ICD-10-CM

## 2016-03-06 DIAGNOSIS — Z79899 Other long term (current) drug therapy: Secondary | ICD-10-CM

## 2016-03-07 LAB — CBC WITH DIFFERENTIAL/PLATELET
Basophils Absolute: 45 cells/uL (ref 0–200)
Basophils Relative: 1 %
Eosinophils Absolute: 90 cells/uL (ref 15–500)
Eosinophils Relative: 2 %
HCT: 46.1 % (ref 38.5–50.0)
Hemoglobin: 16 g/dL (ref 13.2–17.1)
Lymphocytes Relative: 39 %
Lymphs Abs: 1755 cells/uL (ref 850–3900)
MCH: 33.8 pg — ABNORMAL HIGH (ref 27.0–33.0)
MCHC: 34.7 g/dL (ref 32.0–36.0)
MCV: 97.5 fL (ref 80.0–100.0)
MPV: 9.2 fL (ref 7.5–12.5)
Monocytes Absolute: 360 cells/uL (ref 200–950)
Monocytes Relative: 8 %
Neutro Abs: 2250 cells/uL (ref 1500–7800)
Neutrophils Relative %: 50 %
Platelets: 224 10*3/uL (ref 140–400)
RBC: 4.73 MIL/uL (ref 4.20–5.80)
RDW: 14.5 % (ref 11.0–15.0)
WBC: 4.5 10*3/uL (ref 3.8–10.8)

## 2016-03-07 LAB — LIPID PANEL
Cholesterol: 164 mg/dL (ref 125–200)
HDL: 56 mg/dL (ref >=40)
LDL Cholesterol: 95 mg/dL (ref <130)
Total CHOL/HDL Ratio: 2.9 Ratio (ref <=5.0)
Triglycerides: 64 mg/dL (ref <150)
VLDL: 13 mg/dL (ref <30)

## 2016-03-07 LAB — COMPREHENSIVE METABOLIC PANEL
ALT: 26 U/L (ref 9–46)
AST: 26 U/L (ref 10–35)
Albumin: 4.5 g/dL (ref 3.6–5.1)
Alkaline Phosphatase: 106 U/L (ref 40–115)
BUN: 12 mg/dL (ref 7–25)
CO2: 25 mmol/L (ref 20–31)
Calcium: 9 mg/dL (ref 8.6–10.3)
Chloride: 104 mmol/L (ref 98–110)
Creat: 1.37 mg/dL — ABNORMAL HIGH (ref 0.70–1.25)
Glucose, Bld: 94 mg/dL (ref 65–99)
Potassium: 4.2 mmol/L (ref 3.5–5.3)
Sodium: 139 mmol/L (ref 135–146)
Total Bilirubin: 0.8 mg/dL (ref 0.2–1.2)
Total Protein: 7.6 g/dL (ref 6.1–8.1)

## 2016-03-07 LAB — T-HELPER CELL (CD4) - (RCID CLINIC ONLY)
CD4 % Helper T Cell: 22 % — ABNORMAL LOW (ref 33–55)
CD4 T Cell Abs: 380 /uL — ABNORMAL LOW (ref 400–2700)

## 2016-03-09 LAB — HIV-1 RNA QUANT-NO REFLEX-BLD
HIV 1 RNA Quant: <20 copies/mL (ref <20)
HIV-1 RNA Quant, Log: <1.3 Log copies/mL (ref <1.30)

## 2016-03-27 ENCOUNTER — Other Ambulatory Visit: Payer: Medicare Other

## 2016-04-07 ENCOUNTER — Other Ambulatory Visit: Payer: Self-pay | Admitting: Infectious Disease

## 2016-04-07 DIAGNOSIS — B20 Human immunodeficiency virus [HIV] disease: Secondary | ICD-10-CM

## 2016-04-09 ENCOUNTER — Other Ambulatory Visit: Payer: Self-pay | Admitting: Infectious Disease

## 2016-04-09 DIAGNOSIS — B20 Human immunodeficiency virus [HIV] disease: Secondary | ICD-10-CM

## 2016-04-10 ENCOUNTER — Encounter: Payer: Self-pay | Admitting: Internal Medicine

## 2016-04-10 ENCOUNTER — Ambulatory Visit (INDEPENDENT_AMBULATORY_CARE_PROVIDER_SITE_OTHER): Payer: Medicare Other | Admitting: Internal Medicine

## 2016-04-10 VITALS — BP 191/89 | HR 51 | Temp 98.0°F | Wt 257.0 lb

## 2016-04-10 DIAGNOSIS — I10 Essential (primary) hypertension: Secondary | ICD-10-CM | POA: Diagnosis not present

## 2016-04-10 DIAGNOSIS — Z23 Encounter for immunization: Secondary | ICD-10-CM

## 2016-04-10 DIAGNOSIS — Z720 Tobacco use: Secondary | ICD-10-CM

## 2016-04-10 DIAGNOSIS — B182 Chronic viral hepatitis C: Secondary | ICD-10-CM

## 2016-04-10 DIAGNOSIS — B20 Human immunodeficiency virus [HIV] disease: Secondary | ICD-10-CM

## 2016-04-10 DIAGNOSIS — R197 Diarrhea, unspecified: Secondary | ICD-10-CM

## 2016-04-10 DIAGNOSIS — K7460 Unspecified cirrhosis of liver: Secondary | ICD-10-CM

## 2016-04-10 MED ORDER — EMTRICITABINE-TENOFOVIR AF 200-25 MG PO TABS: 1.0000 | ORAL_TABLET | Freq: Every day | ORAL | 5 refills | Status: DC

## 2016-04-10 NOTE — Assessment & Plan Note (Signed)
Stable with immodium.  Had gallbladder out previously.  I recommended fiber for bulking, reduce wine coolers with the osmotic pressure.

## 2016-04-10 NOTE — Assessment & Plan Note (Signed)
Elevated.  On medication with some non compliance.  To see his PCP and adjust as needed.

## 2016-04-10 NOTE — Assessment & Plan Note (Signed)
Doing ok despite some missed doses.  RTC 6 months.

## 2016-04-10 NOTE — Assessment & Plan Note (Signed)
Will need HCC screening though no overt cirrhosis, just F4 on elastography.

## 2016-04-10 NOTE — Progress Notes (Signed)
CC: Follow up for HIV  Interval history: Currently is asymptomatic and well-controlled on Tivicay and Truvada.  He was last seen by me about 2 years ago and has had multiple no shows due to work.  He continues to miss doses on occasion, typically just one but occasionally 2 days. BP up but does miss some doses.  Has an appt with Dr. Donette LarryHusain next month.   Has not had SVR24 for hepatitis C. F4 on elastography.   Prior to Admission medications   Medication Sig Start Date End Date Taking? Authorizing Provider  carvedilol (COREG) 12.5 MG tablet Take 12.5 mg by mouth 2 (two) times daily with a meal.   Yes Historical Provider, MD  FLUoxetine (PROZAC) 40 MG capsule Take 1 capsule (40 mg total) by mouth daily. 03/12/12  Yes Gardiner Barefootobert W Shiva Karis, MD  LORazepam (ATIVAN) 1 MG tablet 1-2 tablets by mouth at bedtime as needed for sleep. 06/12/11  Yes Randall Hissornelius N Van Dam, MD  Multiple Vitamin (MULTIVITAMIN) tablet Take 1 tablet by mouth daily.   Yes Historical Provider, MD  TIVICAY 50 MG tablet TAKE 1 TABLET BY MOUTH EVERY DAY 03/07/16  Yes Randall Hissornelius N Van Dam, MD  triamterene-hydrochlorothiazide (DYAZIDE) 37.5-25 MG per capsule Take 1 each (1 capsule total) by mouth daily. 06/26/11  Yes Gardiner Barefootobert W Kayloni Rocco, MD  TRUVADA 200-300 MG tablet TAKE 1 TABLET BY MOUTH DAILY 03/07/16  Yes Randall Hissornelius N Van Dam, MD  Loperamide HCl (IMODIUM A-D PO) Take by mouth. Takes when has to go to work, approx twice weekly    Historical Provider, MD    Review of Systems Constitutional: negative for fatigue and malaise Gastrointestinal: positive for diarrhea, negative for nausea Musculoskeletal: negative for myalgias and arthralgias All other systems reviewed and are negative   Physical Exam: CONSTITUTIONAL:in no apparent distress and alert  Vitals:   04/10/16 1458  BP: (!) 191/89  Pulse: (!) 51  Temp: 98 F (36.7 C)   Eyes: anicteric HENT: no thrush, no cervical lymphadenopathy Respiratory: Normal respiratory effort; CTA  B Cardiovascular: RRR  Lab Results  Component Value Date   HIV1RNAQUANT <20 03/06/2016   HIV1RNAQUANT <20 11/07/2015   HIV1RNAQUANT <20 07/27/2014   No components found for: HIV1GENOTYPRPLUS No components found for: THELPERCELL   SHx: cutting down on beer, more wine coolers and trying to quit completely.

## 2016-04-10 NOTE — Assessment & Plan Note (Signed)
Now has quit and feels better.

## 2016-04-19 ENCOUNTER — Other Ambulatory Visit: Payer: Self-pay | Admitting: Infectious Disease

## 2016-04-19 ENCOUNTER — Telehealth: Payer: Self-pay

## 2016-04-19 DIAGNOSIS — B20 Human immunodeficiency virus [HIV] disease: Secondary | ICD-10-CM

## 2016-04-19 NOTE — Telephone Encounter (Signed)
Patient left voicemail in triage wanting clarification on what pill he was to stop taking because he could not remember. Called patient back and told him that he is stop taking Truvada and switch to Descovy. Patient stated understanding and said he had also called his pharmacy that gave him the new regimen. Patient wanted to know if there was a medication stronger than the over the counter Immodium A-D he can take because it does work as well now. Explained to patient I will notify Dr. Luciana Axeomer with complaint and he will be contacted once a response is given. Patient stated understanding. Please advise. Rejeana Brockandace Hue Frick, LPN

## 2016-04-23 ENCOUNTER — Other Ambulatory Visit: Payer: Self-pay | Admitting: Internal Medicine

## 2016-04-23 MED ORDER — DIPHENOXYLATE-ATROPINE 2.5-0.025 MG PO TABS: 1.0000 | ORAL_TABLET | Freq: Four times a day (QID) | ORAL | 3 refills | Status: DC | PRN

## 2016-04-23 NOTE — Telephone Encounter (Signed)
He can try lomotil.  I have put on his chart but can't be eprescribed if you can call it in. Also, remind him to use fiber, reduce sugary drinks including wine coolers, that may help more than anything.  thanks

## 2016-04-23 NOTE — Telephone Encounter (Signed)
Called patient to inform him that a prescription of Lomotil was called into [facility] at [address]. Also, educated patient on the importance of reducing sugary drinks and increase his fiber intake to help reduce occurrence of diarrhea episodes. Patient stated understanding. Patient stated he is working on reducing his sugary drink intake and increasing his fiber. Rejeana Brockandace Andros Channing, LPN

## 2016-09-08 ENCOUNTER — Other Ambulatory Visit: Payer: Self-pay | Admitting: Internal Medicine

## 2016-09-08 DIAGNOSIS — B20 Human immunodeficiency virus [HIV] disease: Secondary | ICD-10-CM

## 2016-09-13 ENCOUNTER — Other Ambulatory Visit: Payer: Medicare Other

## 2016-09-27 ENCOUNTER — Ambulatory Visit: Payer: Medicare Other

## 2016-09-27 ENCOUNTER — Ambulatory Visit: Payer: Medicare Other | Admitting: Internal Medicine

## 2016-10-01 ENCOUNTER — Ambulatory Visit: Payer: Medicare Other

## 2016-12-01 ENCOUNTER — Other Ambulatory Visit: Payer: Self-pay | Admitting: Internal Medicine

## 2016-12-03 ENCOUNTER — Ambulatory Visit: Payer: Medicare Other | Admitting: Internal Medicine

## 2016-12-05 ENCOUNTER — Other Ambulatory Visit: Payer: Self-pay | Admitting: Internal Medicine

## 2016-12-06 ENCOUNTER — Other Ambulatory Visit: Payer: Self-pay | Admitting: *Deleted

## 2016-12-06 ENCOUNTER — Ambulatory Visit: Payer: Medicare Other | Admitting: Internal Medicine

## 2016-12-06 MED ORDER — EMTRICITABINE-TENOFOVIR AF 200-25 MG PO TABS: 1.0000 | ORAL_TABLET | Freq: Every day | ORAL | 2 refills | Status: DC

## 2016-12-10 ENCOUNTER — Ambulatory Visit: Payer: Medicare Other | Admitting: Internal Medicine

## 2016-12-24 ENCOUNTER — Ambulatory Visit
Admission: RE | Admit: 2016-12-24 | Discharge: 2016-12-24 | Disposition: A | Discharge status: Home / Self Care | Payer: Medicare Other | Source: Ambulatory Visit | Attending: Internal Medicine | Admitting: Internal Medicine

## 2016-12-24 ENCOUNTER — Other Ambulatory Visit: Payer: Self-pay | Admitting: Internal Medicine

## 2016-12-24 DIAGNOSIS — M545 Low back pain: Secondary | ICD-10-CM

## 2017-01-04 ENCOUNTER — Other Ambulatory Visit: Payer: Self-pay | Admitting: Internal Medicine

## 2017-02-08 ENCOUNTER — Other Ambulatory Visit: Payer: Self-pay | Admitting: Internal Medicine

## 2017-02-08 DIAGNOSIS — B20 Human immunodeficiency virus [HIV] disease: Secondary | ICD-10-CM

## 2017-03-11 ENCOUNTER — Other Ambulatory Visit: Payer: Self-pay | Admitting: Internal Medicine

## 2017-03-11 DIAGNOSIS — B20 Human immunodeficiency virus [HIV] disease: Secondary | ICD-10-CM

## 2017-03-15 ENCOUNTER — Other Ambulatory Visit: Payer: Self-pay | Admitting: Internal Medicine

## 2017-03-15 DIAGNOSIS — B20 Human immunodeficiency virus [HIV] disease: Secondary | ICD-10-CM

## 2017-04-26 ENCOUNTER — Other Ambulatory Visit: Payer: Self-pay | Admitting: Internal Medicine

## 2017-04-26 DIAGNOSIS — B20 Human immunodeficiency virus [HIV] disease: Secondary | ICD-10-CM

## 2017-05-06 ENCOUNTER — Ambulatory Visit: Payer: Medicare Other

## 2017-05-06 ENCOUNTER — Other Ambulatory Visit (HOSPITAL_COMMUNITY)
Admission: RE | Admit: 2017-05-06 | Discharge: 2017-05-06 | Disposition: A | Discharge status: Home / Self Care | Payer: Medicare Other | Source: Ambulatory Visit | Attending: Internal Medicine | Admitting: Internal Medicine

## 2017-05-06 ENCOUNTER — Ambulatory Visit (INDEPENDENT_AMBULATORY_CARE_PROVIDER_SITE_OTHER): Payer: Medicare Other | Admitting: Pharmacist

## 2017-05-06 DIAGNOSIS — B20 Human immunodeficiency virus [HIV] disease: Secondary | ICD-10-CM | POA: Diagnosis not present

## 2017-05-06 MED ORDER — DOLUTEGRAVIR SODIUM 50 MG PO TABS: 50.0000 mg | ORAL_TABLET | Freq: Every day | ORAL | 5 refills | Status: DC

## 2017-05-06 MED ORDER — DARUNAVIR-COBICISTAT 800-150 MG PO TABS: 1.0000 | ORAL_TABLET | Freq: Every day | ORAL | 5 refills | Status: DC

## 2017-05-06 MED FILL — TIVICAY 50 MG TABLET: 50 | 30 days supply | Qty: 30 | Fill #0

## 2017-05-06 MED FILL — PREZCOBIX 800 MG-150 MG TAB: 800-150 | 30 days supply | Qty: 30 | Fill #0

## 2017-05-06 NOTE — Progress Notes (Signed)
HPI: Erik Frederick is a 61 y.o. male who presents to the [place] [facility] for HIV follow-up after being out of care for over a year.  Allergies: No Known Allergies  Past Medical History: Past Medical History:  Diagnosis Date  . Anxiety   . Cirrhosis ([place])   . Depression   . GERD (gastroesophageal reflux disease)   . Hepatitis C   . HIV infection ([place])   . Hypertension   . Insomnia   . Osteoarthritis   . Pneumonia     Social History: Social History   Social History  . Marital status: Married    Spouse name: N/A  . Number of children: 4  . Years of education: N/A   Occupational History  . retired    Social History Main Topics  . Smoking status: Former Smoker    Packs/day: 0.25    Types: Cigarettes    Start date: 01/11/2013    Quit date: 02/25/2016  . Smokeless tobacco: Never Used  . Alcohol use 12.6 oz/week    21 Glasses of wine per week     Comment: beer  . Drug use: No  . Sexual activity: Yes    Partners: Female    Birth control/ protection: Condom     Comment: pt. given condoms   Other Topics Concern  . Not on file   Social History Narrative  . No narrative on file    Current Regimen: Tivicay and Descovy  Labs: HIV 1 RNA Quant (copies/mL)  Date Value  03/06/2016 <20  11/07/2015 <20  07/27/2014 <20   CD4 T Cell Abs (/uL)  Date Value  03/06/2016 380 (L)  11/07/2015 380 (L)  07/27/2014 330 (L)   Hep B S Ab (no units)  Date Value  10/15/2014 POS (A)   Hepatitis B Surface Ag (no units)  Date Value  10/15/2014 NEGATIVE   HCV Ab (no units)  Date Value  10/20/2009 REACTIVE (A)    CrCl: CrCl cannot be calculated (Patient's most recent lab result is older than the maximum 21 days allowed.).  Lipids:    Component Value Date/Time   CHOL 164 03/06/2016 1401   TRIG 64 03/06/2016 1401   HDL 56 03/06/2016 1401   CHOLHDL 2.9 03/06/2016 1401   VLDL 13 03/06/2016 1401   LDLCALC 95 03/06/2016 1401    Assessment: Erik Frederick is here today  to follow-up for his HIV infection after being out of care since August 2017.  He last saw Dr. Linus Salmons at that time and was doing well on his regimen of Tivicay and Descovy. His labs always look good with undetectable viral loads. He states he has put his health aside recently as he had to move his wife (they were separated) from [place] back in to live with him because she got really sick. He said that is all over now and he wants to get back into care.  He had been taking his Tivicay and Descovy but we refilled one without the other, so he has been taking Descovy only for the last 1-2 months.  He then ran out of Descovy last Friday. He is quite worried about running out and only taking one without the other.  I told him that in the future he should have called [place] and to either always take his full regimen or none at all.  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  I spent time discussing this regimen and the importance of taking it with a meal. He states he already takes his medications with a meal to help with any nausea he may develop.   He has [facility] Medicare and applied to renew his SPAP today with Juliann Pulse.  He has already met his deductible for the year, so we are able to fill his HIV medications at [place] at [place] copay.  He will have it mailed to his house. I told him that this regimen could be temporary until we know more information on his resistance. I will get routine labs today as well.  He does not have any complaints today - no fevers, night sweats, SOB, chest pain, etc.  He will come back and see me in 3 weeks.  Plans: - Stop Descovy - Continue Tivicay PO once daily - Start Prezcobix PO once daily with food - HIV RNA with reflex, CD4, CBC, CMET, urine STI screening, RPR today - F/u with me again 10/15 at 130pm  Viona Hosking L. Anda Sobotta,  PharmD, [facility] 05/06/2017, 3:15 PM

## 2017-05-07 LAB — COMPREHENSIVE METABOLIC PANEL
AG Ratio: 1.4 (calc) (ref 1.0–2.5)
ALT: 46 U/L (ref 9–46)
AST: 37 U/L — ABNORMAL HIGH (ref 10–35)
Albumin: 4.5 g/dL (ref 3.6–5.1)
Alkaline phosphatase (APISO): 108 U/L (ref 40–115)
BUN: 9 mg/dL (ref 7–25)
CO2: 29 mmol/L (ref 20–32)
Calcium: 9.5 mg/dL (ref 8.6–10.3)
Chloride: 103 mmol/L (ref 98–110)
Creat: 1.08 mg/dL (ref 0.70–1.25)
Globulin: 3.3 g/dL (calc) (ref 1.9–3.7)
Glucose, Bld: 95 mg/dL (ref 65–99)
Potassium: 4.1 mmol/L (ref 3.5–5.3)
Sodium: 140 mmol/L (ref 135–146)
Total Bilirubin: 0.8 mg/dL (ref 0.2–1.2)
Total Protein: 7.8 g/dL (ref 6.1–8.1)

## 2017-05-07 LAB — CBC
HCT: 44.8 % (ref 38.5–50.0)
Hemoglobin: 15.9 g/dL (ref 13.2–17.1)
MCH: 34 pg — ABNORMAL HIGH (ref 27.0–33.0)
MCHC: 35.5 g/dL (ref 32.0–36.0)
MCV: 95.9 fL (ref 80.0–100.0)
MPV: 9.3 fL (ref 7.5–12.5)
Platelets: 207 10*3/uL (ref 140–400)
RBC: 4.67 10*6/uL (ref 4.20–5.80)
RDW: 13.4 % (ref 11.0–15.0)
WBC: 4.4 10*3/uL (ref 3.8–10.8)

## 2017-05-07 LAB — T-HELPER CELL (CD4) - (RCID CLINIC ONLY)
CD4 % Helper T Cell: 22 % — ABNORMAL LOW (ref 33–55)
CD4 T Cell Abs: 410 /uL (ref 400–2700)

## 2017-05-07 LAB — URINE CYTOLOGY ANCILLARY ONLY
Chlamydia: NEGATIVE
Neisseria Gonorrhea: NEGATIVE

## 2017-05-07 LAB — RPR: RPR Ser Ql: NONREACTIVE

## 2017-05-08 ENCOUNTER — Encounter: Payer: Self-pay | Admitting: Internal Medicine

## 2017-05-11 LAB — HIV RNA, RTPCR W/R GT (RTI, PI,INT)
HIV 1 RNA Quant: 32 copies/mL — ABNORMAL HIGH
HIV-1 RNA Quant, Log: 1.51 Log copies/mL — ABNORMAL HIGH

## 2017-05-27 ENCOUNTER — Ambulatory Visit (INDEPENDENT_AMBULATORY_CARE_PROVIDER_SITE_OTHER): Payer: Medicare Other | Admitting: Pharmacist

## 2017-05-27 DIAGNOSIS — B20 Human immunodeficiency virus [HIV] disease: Secondary | ICD-10-CM

## 2017-05-27 DIAGNOSIS — Z23 Encounter for immunization: Secondary | ICD-10-CM

## 2017-05-27 MED ORDER — BICTEGRAVIR-EMTRICITAB-TENOFOV 50-200-25 MG PO TABS: 1.0000 | ORAL_TABLET | Freq: Every day | ORAL | 5 refills | Status: DC

## 2017-05-27 NOTE — Progress Notes (Addendum)
HPI: Erik Frederick is a 61 y.o. male who presents to the [facility] clinic for HIV follow-up.  Allergies: No Known Allergies  Past Medical History: Past Medical History:  Diagnosis Date  . Anxiety   . Cirrhosis ([facility])   . Depression   . GERD (gastroesophageal reflux disease)   . Hepatitis C   . HIV infection ([facility])   . Hypertension   . Insomnia   . Osteoarthritis   . Pneumonia     Social History: Social History   Social History  . Marital status: Married    Spouse name: N/A  . Number of children: 4  . Years of education: N/A   Occupational History  . retired    Social History Main Topics  . Smoking status: Former Smoker    Packs/day: 0.25    Types: Cigarettes    Start date: 01/11/2013    Quit date: 02/25/2016  . Smokeless tobacco: Never Used  . Alcohol use 12.6 oz/week    21 Glasses of wine per week     Comment: beer  . Drug use: No  . Sexual activity: Yes    Partners: Female    Birth control/ protection: Condom     Comment: pt. given condoms   Other Topics Concern  . Not on file   Social History Narrative  . No narrative on file    Current Regimen: Tivicay and Prezcobix  Labs: HIV 1 RNA Quant (copies/mL)  Date Value  05/06/2017 32 (H)  03/06/2016 <20  11/07/2015 <20   CD4 T Cell Abs (/uL)  Date Value  05/06/2017 410  03/06/2016 380 (L)  11/07/2015 380 (L)   Hep B S Ab (no units)  Date Value  10/15/2014 POS (A)   Hepatitis B Surface Ag (no units)  Date Value  10/15/2014 NEGATIVE   HCV Ab (no units)  Date Value  10/20/2009 REACTIVE (A)    CrCl: CrCl cannot be calculated (Patient's most recent lab result is older than the maximum 21 days allowed.).  Lipids:    Component Value Date/Time   CHOL 164 03/06/2016 1401   TRIG 64 03/06/2016 1401   HDL 56 03/06/2016 1401   CHOLHDL 2.9 03/06/2016 1401   VLDL 13 03/06/2016 1401   LDLCALC 95 03/06/2016 1401    Assessment: Erik Frederick is here today to follow-up for his HIV infection.   He recently saw me ~3 weeks ago after not being seen for a year. He was taking his Tivicay and Descovy fine until he ran out of the Tivicay and was taking Descovy monotherapy for ~1 month or so.  He came in and I changed him to Tivicay and Prezcobix until resistance data came back.  His viral load actually came back at 32, so no resistance testing could be done.  He is having a hard time tolerating the Prezcobix as it is giving him diarrhea 3-4 times per day.  He is anxious to see if he can be switched to something else.  He does have some resistance from testing back in 2009 - no other resistance data available as his viral load has been relatively undetectable since then.  HIV Genotype Composite Data Genotype Dates: 01/01/2008  Mutations in [place] impact drug susceptibility RT Mutations  K103N, M184V, P225H  PI Mutations  L10I, I13V, K20I, I62V, L63P, A71T, V77I  Integrase Mutations    Nucleoside RTIs  Abacavir - low level resistance Zidovudine - susceptible Emtricitabine - high level resistance Lamivudine - high level resistance Tenofovir -  susceptible   Non-Nucleoside RTIs  Efavirenz - high level resistance Etravirine - susceptible Nevirapine - high level resistance Rilpivirine - susceptible   Protease Inhibitors  Atazanavir - susceptible Darunavir - susceptible Lopinavir - susceptible   Integrase Inhibitors  N/A   It looks like back in 2012 he was on Isentress + Truvada but was changed to Prezista/Norvir + Truvada in 2013 because he was missing doses.  In 2015, he was changed to Tivicay + Truvada in anticipation of starting and completing therapy for his Hep C co-infection.  He was then changed to Tivicay and Descovy in 2017 and remained on that regimen until he saw me.  He states that he has been separating the Prezcobix and Tivicay because of the side effects.  He takes Tivicay in the morning and Prezcobix at night.  I told him that he should take both at the same time each day and  not separate them out.  He is going to [place] in 2 weeks and is really hoping to be changed to something else.  I would like to change him to [facility] - it has a high barrier to resistance and is a once daily regimen since it seems like that will work best for him.  He has [employer] but it is a Astronomer so it was approved (called [facility] to verify). I will hold off on getting labs today since we are changing his regimen again.  He sees Dr. Luciana Axe at the end of November and he can check his HIV labs then.  He will try imodium for his diarrhea but hopefully it goes away off of the Prezcobix.  He will call me if it does not resolve before he goes to [place] at the end of the month. I also gave him a flu shot today.  Plans: - Stop Tivicay and Prezcobix - Start Biktarvy PO once daily - Flu shot - F/u with Dr.Comer 11/26 at 3pm  Jontez Redfield L. Devan Danzer, PharmD, CPP Infectious Diseases Clinical Pharmacist [facility] 05/27/2017, 3:01 PM

## 2017-07-08 ENCOUNTER — Ambulatory Visit (INDEPENDENT_AMBULATORY_CARE_PROVIDER_SITE_OTHER): Payer: Medicare Other | Admitting: Licensed Clinical Social Worker

## 2017-07-08 ENCOUNTER — Ambulatory Visit (INDEPENDENT_AMBULATORY_CARE_PROVIDER_SITE_OTHER): Payer: Medicare Other | Admitting: Internal Medicine

## 2017-07-08 ENCOUNTER — Encounter: Payer: Self-pay | Admitting: Internal Medicine

## 2017-07-08 DIAGNOSIS — B20 Human immunodeficiency virus [HIV] disease: Secondary | ICD-10-CM

## 2017-07-08 DIAGNOSIS — K7460 Unspecified cirrhosis of liver: Secondary | ICD-10-CM

## 2017-07-08 DIAGNOSIS — F4321 Adjustment disorder with depressed mood: Secondary | ICD-10-CM

## 2017-07-08 DIAGNOSIS — F4320 Adjustment disorder, unspecified: Secondary | ICD-10-CM | POA: Insufficient documentation

## 2017-07-08 DIAGNOSIS — Z634 Disappearance and death of family member: Secondary | ICD-10-CM

## 2017-07-08 NOTE — Assessment & Plan Note (Signed)
I will schedule him for an ultrasound next visit

## 2017-07-08 NOTE — Assessment & Plan Note (Signed)
He continues to struggle with this.  He will pick up his refill and come back in 2 weeks with me and I will check his virus then.

## 2017-07-08 NOTE — Progress Notes (Signed)
   Subjective:    Patient ID: Erik Frederick, male    DOB: 1955/11/01, 61 y.o.   MRN: [id number]  HPI Here for follow up of HIV Had not been seen in a year and came back into care with Westley Chandlerassie K PharmD.  After some time, he was changed to [facility] and here for follow up with me.  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  During his prolonged abscence, he was taking descovy alone for some time after running out of Tivicay.  He was taking Prezcobix and Tivicay while awaiting resisstance testing but no resistance testing has been done due to otherwise suppressed virus so was switched to [facility] [employer].  Unfortunately he has been out for 3 days and did not get his refill.  He otherwise is tolerating it well and pleased with the regimen.   Lost his wife in June after a prolonged illness.     Review of Systems  Constitutional: Negative for fatigue.  Gastrointestinal: Negative for diarrhea and nausea.  Skin: Negative for rash.       Objective:   Physical Exam  Constitutional: He appears well-developed and well-nourished. No distress.  HENT:  Mouth/Throat: No oropharyngeal exudate.  Eyes: No scleral icterus.  Cardiovascular: Normal rate, regular rhythm and normal heart sounds.  Lymphadenopathy:    He has no cervical adenopathy.   SH: former smoker       Assessment & Plan:

## 2017-07-08 NOTE — Assessment & Plan Note (Signed)
Some difficulty with recent loss of his wife.  He was seen by our counselor.  Will follow up with her.

## 2017-07-09 NOTE — BH Specialist Note (Signed)
Integrated Behavioral Health Initial Visit  MRN: [id number] Name: Erik BeneJeffery A Cogliano  Number of Integrated Behavioral Health Clinician visits:: 1/6 Session Start time: 3:42 pm  Session End time: 4:05 pm Total time: 23 mins  Type of Service: Integrated Behavioral Health- Individual/Family Interpretor:No. Interpretor Name and Language: N/A   Warm Hand Off Completed.       SUBJECTIVE: Erik Frederick is a 61 y.o. male accompanied by self Patient was referred by Dr. Luciana Axeomer for grief.  Patient reports the following symptoms/concerns: Mood is "melancholy" (was happy over the Thanksgiving holiday due to seeing family) and experiences social isolation.  Patient denied anhedonia, appetite or sleep changes (reports sleeping 6-7 hours a night), or hopelessness.   Patient's wife of 20 years passed away in June and patient is still grieving.  Patient denied experience of suicidal ideations and stated that it was prohibited by his Catholic religion.  Duration of problem: since June 2018; Severity of problem: moderate  OBJECTIVE: Mood: sad and Affect: Appropriate Risk of harm to self or others: No plan to harm self or others  LIFE CONTEXT: Family and Social: Patient lives alone but has 4 sons and grandchildren for support. Self-Care: Patient is able to tend to his ADL's and is treatment compliant. Life Changes: Patient is still grieving the loss of his wife and adjusting to being a widow.  ASSESSMENT: Patient is currently experiencing difficulty adjusting to being a widow and may benefit from grief counseling and behavioral health services.  GOALS ADDRESSED: Patient will: 1. Reduce symptoms of: grief 2. Increase knowledge and/or ability of: coping skills and healthy habits  3. Demonstrate ability to: Increase healthy adjustment to current life circumstances, Increase adequate support systems for patient/family and Begin healthy grieving over loss  INTERVENTIONS: Interventions utilized:  Supportive Counseling and Link to [facility]  PLAN: 1. Referral(s): Community Resources:  Grief counseling 2. Recommendations:  Utilize provided resources for the [facility] and attend grief groups and grief counseling.  Vergia AlbertsSherry Issiah Huffaker, [facility]

## 2017-07-22 ENCOUNTER — Ambulatory Visit: Payer: Medicare Other | Admitting: Internal Medicine

## 2017-07-22 ENCOUNTER — Other Ambulatory Visit: Payer: Medicare Other

## 2017-08-15 ENCOUNTER — Other Ambulatory Visit: Payer: Self-pay | Admitting: Internal Medicine

## 2017-08-15 ENCOUNTER — Other Ambulatory Visit: Payer: Medicare Other

## 2017-08-15 DIAGNOSIS — B20 Human immunodeficiency virus [HIV] disease: Secondary | ICD-10-CM

## 2017-08-16 LAB — COMPLETE METABOLIC PANEL WITH GFR
AG Ratio: 1.4 (calc) (ref 1.0–2.5)
ALT: 32 U/L (ref 9–46)
AST: 28 U/L (ref 10–35)
Albumin: 4.3 g/dL (ref 3.6–5.1)
Alkaline phosphatase (APISO): 96 U/L (ref 40–115)
BUN/Creatinine Ratio: 8 (calc) (ref 6–22)
BUN: 10 mg/dL (ref 7–25)
CO2: 28 mmol/L (ref 20–32)
Calcium: 9.2 mg/dL (ref 8.6–10.3)
Chloride: 103 mmol/L (ref 98–110)
Creat: 1.26 mg/dL — ABNORMAL HIGH (ref 0.70–1.25)
GFR, Est African American: 70 mL/min/{1.73_m2} (ref >=60)
GFR, Est Non African American: 61 mL/min/{1.73_m2} (ref >=60)
Globulin: 3.1 g/dL (calc) (ref 1.9–3.7)
Glucose, Bld: 88 mg/dL (ref 65–99)
Potassium: 3.9 mmol/L (ref 3.5–5.3)
Sodium: 137 mmol/L (ref 135–146)
Total Bilirubin: 0.8 mg/dL (ref 0.2–1.2)
Total Protein: 7.4 g/dL (ref 6.1–8.1)

## 2017-08-16 LAB — CBC WITH DIFFERENTIAL/PLATELET
Basophils Absolute: 11 cells/uL (ref 0–200)
Basophils Relative: 0.2 %
Eosinophils Absolute: 112 cells/uL (ref 15–500)
Eosinophils Relative: 2 %
HCT: 43.3 % (ref 38.5–50.0)
Hemoglobin: 15.5 g/dL (ref 13.2–17.1)
Lymphs Abs: 1966 cells/uL (ref 850–3900)
MCH: 34.4 pg — ABNORMAL HIGH (ref 27.0–33.0)
MCHC: 35.8 g/dL (ref 32.0–36.0)
MCV: 96.2 fL (ref 80.0–100.0)
MPV: 9 fL (ref 7.5–12.5)
Monocytes Relative: 8.9 %
Neutro Abs: 3013 cells/uL (ref 1500–7800)
Neutrophils Relative %: 53.8 %
Platelets: 223 10*3/uL (ref 140–400)
RBC: 4.5 10*6/uL (ref 4.20–5.80)
RDW: 14 % (ref 11.0–15.0)
Total Lymphocyte: 35.1 %
WBC mixed population: 498 cells/uL (ref 200–950)
WBC: 5.6 10*3/uL (ref 3.8–10.8)

## 2017-08-16 LAB — T-HELPER CELL (CD4) - (RCID CLINIC ONLY)
CD4 % Helper T Cell: 19 % — ABNORMAL LOW (ref 33–55)
CD4 T Cell Abs: 430 /uL (ref 400–2700)

## 2017-08-19 ENCOUNTER — Ambulatory Visit: Payer: Medicare Other | Admitting: Internal Medicine

## 2017-08-19 LAB — HIV-1 RNA QUANT-NO REFLEX-BLD
HIV 1 RNA Quant: <20 copies/mL
HIV-1 RNA Quant, Log: <1.3 Log copies/mL

## 2017-10-08 ENCOUNTER — Ambulatory Visit: Payer: Medicare Other | Admitting: Internal Medicine

## 2017-10-08 ENCOUNTER — Encounter: Payer: Self-pay | Admitting: Internal Medicine

## 2017-10-08 VITALS — BP 163/99 | HR 53 | Temp 98.1°F | Ht 71.5 in | Wt 257.0 lb

## 2017-10-08 DIAGNOSIS — F4321 Adjustment disorder with depressed mood: Secondary | ICD-10-CM | POA: Diagnosis not present

## 2017-10-08 DIAGNOSIS — K7460 Unspecified cirrhosis of liver: Secondary | ICD-10-CM | POA: Diagnosis not present

## 2017-10-08 DIAGNOSIS — Z7184 Encounter for health counseling related to travel: Secondary | ICD-10-CM

## 2017-10-08 DIAGNOSIS — B20 Human immunodeficiency virus [HIV] disease: Secondary | ICD-10-CM

## 2017-10-08 DIAGNOSIS — Z7189 Other specified counseling: Secondary | ICD-10-CM

## 2017-10-08 LAB — LIPID PANEL
Cholesterol: 160 mg/dL (ref <200)
HDL: 55 mg/dL (ref >40)
LDL Cholesterol (Calc): 87 mg/dL (calc)
Non-HDL Cholesterol (Calc): 105 mg/dL (calc) (ref <130)
Total CHOL/HDL Ratio: 2.9 (calc) (ref <5.0)
Triglycerides: 86 mg/dL (ref <150)

## 2017-10-08 LAB — COMPLETE METABOLIC PANEL WITH GFR
AG Ratio: 1.4 (calc) (ref 1.0–2.5)
ALT: 26 U/L (ref 9–46)
AST: 25 U/L (ref 10–35)
Albumin: 4.1 g/dL (ref 3.6–5.1)
Alkaline phosphatase (APISO): 86 U/L (ref 40–115)
BUN/Creatinine Ratio: 8 (calc) (ref 6–22)
BUN: 10 mg/dL (ref 7–25)
CO2: 28 mmol/L (ref 20–32)
Calcium: 9.3 mg/dL (ref 8.6–10.3)
Chloride: 103 mmol/L (ref 98–110)
Creat: 1.28 mg/dL — ABNORMAL HIGH (ref 0.70–1.25)
GFR, Est African American: 69 mL/min/{1.73_m2} (ref >=60)
GFR, Est Non African American: 59 mL/min/{1.73_m2} — ABNORMAL LOW (ref >=60)
Globulin: 2.9 g/dL (calc) (ref 1.9–3.7)
Glucose, Bld: 89 mg/dL (ref 65–99)
Potassium: 3.9 mmol/L (ref 3.5–5.3)
Sodium: 138 mmol/L (ref 135–146)
Total Bilirubin: 0.8 mg/dL (ref 0.2–1.2)
Total Protein: 7 g/dL (ref 6.1–8.1)

## 2017-10-08 MED ORDER — TYPHOID VACCINE PO CPDR: 1.0000 | DELAYED_RELEASE_CAPSULE | ORAL | 0 refills | Status: DC

## 2017-10-08 NOTE — Progress Notes (Signed)
   Subjective:    Patient ID: Erik Frederick, male    DOB: 1955-12-23, 62 y.o.   MRN: [id number]  HPI Here for follow up of HIV Has recently been poorly compliant after losing his wife last year.  He got back into care and started [facility] [place], despite a 184V mutation.  He had labs early January and viral load < 20 and CD4 good at 430.  No associated rash.   [place]     Review of Systems  Constitutional: Negative for fatigue.  Skin: Negative for rash.       Objective:   Physical Exam  Constitutional: He appears well-developed and well-nourished. No distress.  HENT:  Mouth/Throat: No oropharyngeal exudate.  Eyes: No scleral icterus.  Cardiovascular: Normal rate, regular rhythm and normal heart sounds.  No murmur heard. Pulmonary/Chest: Effort normal and breath sounds normal. No respiratory distress.  Skin: No rash noted.   SH: former smoker       Assessment & Plan:

## 2017-10-08 NOTE — Assessment & Plan Note (Signed)
[facility]  Will order now.

## 2017-10-08 NOTE — Assessment & Plan Note (Signed)
Doing well know. I will recheck labs today and if ok, rtc 4 months.

## 2017-10-08 NOTE — Assessment & Plan Note (Signed)
Counseled on safe travel including medical insurance, food precautions.  Discussed altitude illness. Hep A immune;  Gave Typhoid oral vaccine prescription May consider diamox if he feels he is at risk.

## 2017-10-08 NOTE — Assessment & Plan Note (Signed)
Will try to find provider to accomodate schedule.

## 2017-10-09 LAB — T-HELPER CELL (CD4) - (RCID CLINIC ONLY)
CD4 % Helper T Cell: 22 % — ABNORMAL LOW (ref 33–55)
CD4 T Cell Abs: 310 /uL — ABNORMAL LOW (ref 400–2700)

## 2017-10-10 LAB — HIV-1 RNA QUANT-NO REFLEX-BLD
HIV 1 RNA Quant: <20 copies/mL
HIV-1 RNA Quant, Log: <1.3 Log copies/mL

## 2017-10-18 ENCOUNTER — Other Ambulatory Visit: Payer: Medicare Other

## 2017-12-18 ENCOUNTER — Other Ambulatory Visit: Payer: Self-pay

## 2017-12-18 DIAGNOSIS — B20 Human immunodeficiency virus [HIV] disease: Secondary | ICD-10-CM

## 2017-12-18 MED ORDER — BICTEGRAVIR-EMTRICITAB-TENOFOV 50-200-25 MG PO TABS: 1.0000 | ORAL_TABLET | Freq: Every day | ORAL | 5 refills | Status: DC

## 2018-01-20 ENCOUNTER — Other Ambulatory Visit: Payer: Medicare Other

## 2018-01-20 DIAGNOSIS — B20 Human immunodeficiency virus [HIV] disease: Secondary | ICD-10-CM

## 2018-01-21 LAB — T-HELPER CELL (CD4) - (RCID CLINIC ONLY)
CD4 % Helper T Cell: 23 % — ABNORMAL LOW (ref 33–55)
CD4 T Cell Abs: 350 /uL — ABNORMAL LOW (ref 400–2700)

## 2018-01-22 LAB — HIV-1 RNA QUANT-NO REFLEX-BLD
HIV 1 RNA Quant: <20 copies/mL
HIV-1 RNA Quant, Log: <1.3 Log copies/mL

## 2018-02-03 ENCOUNTER — Encounter: Payer: Self-pay | Admitting: Internal Medicine

## 2018-02-03 ENCOUNTER — Ambulatory Visit (INDEPENDENT_AMBULATORY_CARE_PROVIDER_SITE_OTHER): Payer: Medicare Other | Admitting: Internal Medicine

## 2018-02-03 VITALS — BP 166/91 | HR 56 | Ht 71.0 in | Wt 257.0 lb

## 2018-02-03 DIAGNOSIS — Z23 Encounter for immunization: Secondary | ICD-10-CM | POA: Diagnosis not present

## 2018-02-03 DIAGNOSIS — Z7189 Other specified counseling: Secondary | ICD-10-CM

## 2018-02-03 DIAGNOSIS — B20 Human immunodeficiency virus [HIV] disease: Secondary | ICD-10-CM

## 2018-02-03 DIAGNOSIS — Z7185 Encounter for immunization safety counseling: Secondary | ICD-10-CM

## 2018-02-03 DIAGNOSIS — K7460 Unspecified cirrhosis of liver: Secondary | ICD-10-CM

## 2018-02-03 DIAGNOSIS — Z7184 Encounter for health counseling related to travel: Secondary | ICD-10-CM

## 2018-02-03 NOTE — Addendum Note (Signed)
Addended by: Gildardo GriffesHILL, Michol Emory M on: 02/03/2018 04:16 PM   Modules accepted: Orders

## 2018-02-03 NOTE — Progress Notes (Signed)
   Subjective:    Patient ID: Erik Frederick, male    DOB: 1956-07-11, 62 y.o.   MRN: [id number]  HPI Here for follow up of HIV After some recent issues of poor compliance, he got back into care and started [facility], despite a 184V mutation.  He is doing well and no missed doses.  Labs with a viral load < 20 and CD4 good at 350.  No associated rash.   Still [place]     Review of Systems  Constitutional: Negative for fatigue.  Skin: Negative for rash.       Objective:   Physical Exam  Constitutional: He appears well-developed and well-nourished. No distress.  HENT:  Mouth/Throat: No oropharyngeal exudate.  Eyes: No scleral icterus.  Cardiovascular: Normal rate, regular rhythm and normal heart sounds.  No murmur heard. Pulmonary/Chest: Effort normal and breath sounds normal. No respiratory distress.  Skin: No rash noted.   SH: former smoker       Assessment & Plan:

## 2018-02-03 NOTE — Assessment & Plan Note (Signed)
Doing great now.  Will recheck in 4 months.

## 2018-02-03 NOTE — Assessment & Plan Note (Signed)
Counseled on the vaccine and given today

## 2018-02-03 NOTE — Assessment & Plan Note (Signed)
He is going to reschedule [facility].

## 2018-02-03 NOTE — Assessment & Plan Note (Signed)
Typhoid vaccine given today

## 2018-02-17 ENCOUNTER — Inpatient Hospital Stay: Admission: RE | Admit: 2018-02-17 | Payer: Medicare Other | Source: Ambulatory Visit

## 2018-04-09 ENCOUNTER — Other Ambulatory Visit: Payer: Self-pay

## 2018-04-09 ENCOUNTER — Encounter (HOSPITAL_COMMUNITY): Payer: Self-pay | Admitting: Emergency Medicine

## 2018-04-09 ENCOUNTER — Emergency Department (HOSPITAL_COMMUNITY): Payer: Medicare Other

## 2018-04-09 ENCOUNTER — Observation Stay (HOSPITAL_COMMUNITY)
Admission: EM | Admit: 2018-04-09 | Discharge: 2018-04-11 | Disposition: A | Discharge status: Home / Self Care | Payer: Medicare Other | Attending: Internal Medicine | Admitting: Internal Medicine

## 2018-04-09 DIAGNOSIS — B20 Human immunodeficiency virus [HIV] disease: Secondary | ICD-10-CM | POA: Diagnosis present

## 2018-04-09 DIAGNOSIS — I160 Hypertensive urgency: Secondary | ICD-10-CM | POA: Diagnosis not present

## 2018-04-09 DIAGNOSIS — F32A Depression, unspecified: Secondary | ICD-10-CM | POA: Diagnosis present

## 2018-04-09 DIAGNOSIS — F329 Major depressive disorder, single episode, unspecified: Secondary | ICD-10-CM | POA: Insufficient documentation

## 2018-04-09 DIAGNOSIS — F17210 Nicotine dependence, cigarettes, uncomplicated: Secondary | ICD-10-CM | POA: Insufficient documentation

## 2018-04-09 DIAGNOSIS — R0789 Other chest pain: Principal | ICD-10-CM | POA: Insufficient documentation

## 2018-04-09 DIAGNOSIS — Z79899 Other long term (current) drug therapy: Secondary | ICD-10-CM | POA: Insufficient documentation

## 2018-04-09 DIAGNOSIS — D72819 Decreased white blood cell count, unspecified: Secondary | ICD-10-CM | POA: Diagnosis not present

## 2018-04-09 DIAGNOSIS — F419 Anxiety disorder, unspecified: Secondary | ICD-10-CM | POA: Diagnosis not present

## 2018-04-09 DIAGNOSIS — Z21 Asymptomatic human immunodeficiency virus [HIV] infection status: Secondary | ICD-10-CM | POA: Insufficient documentation

## 2018-04-09 DIAGNOSIS — E876 Hypokalemia: Secondary | ICD-10-CM | POA: Diagnosis not present

## 2018-04-09 DIAGNOSIS — B1920 Unspecified viral hepatitis C without hepatic coma: Secondary | ICD-10-CM | POA: Insufficient documentation

## 2018-04-09 DIAGNOSIS — K7460 Unspecified cirrhosis of liver: Secondary | ICD-10-CM | POA: Insufficient documentation

## 2018-04-09 DIAGNOSIS — R079 Chest pain, unspecified: Secondary | ICD-10-CM | POA: Diagnosis present

## 2018-04-09 HISTORY — DX: Pure hypercholesterolemia, unspecified: E78.00

## 2018-04-09 LAB — BASIC METABOLIC PANEL
Anion gap: 11 (ref 5–15)
BUN: 12 mg/dL (ref 8–23)
CO2: 25 mmol/L (ref 22–32)
Calcium: 9.7 mg/dL (ref 8.9–10.3)
Chloride: 104 mmol/L (ref 98–111)
Creatinine, Ser: 1.14 mg/dL (ref 0.61–1.24)
GFR calc Af Amer: >60 mL/min (ref >60)
GFR calc non Af Amer: >60 mL/min (ref >60)
Glucose, Bld: 139 mg/dL — ABNORMAL HIGH (ref 70–99)
Potassium: 3.3 mmol/L — ABNORMAL LOW (ref 3.5–5.1)
Sodium: 140 mmol/L (ref 135–145)

## 2018-04-09 LAB — I-STAT TROPONIN, ED: Troponin i, poc: 0.08 ng/mL (ref 0.00–0.08)

## 2018-04-09 LAB — CBC
HCT: 41.4 % (ref 39.0–52.0)
Hemoglobin: 14.1 g/dL (ref 13.0–17.0)
MCH: 33.7 pg (ref 26.0–34.0)
MCHC: 34.1 g/dL (ref 30.0–36.0)
MCV: 99 fL (ref 78.0–100.0)
Platelets: 204 10*3/uL (ref 150–400)
RBC: 4.18 MIL/uL — ABNORMAL LOW (ref 4.22–5.81)
RDW: 12.7 % (ref 11.5–15.5)
WBC: 3.7 10*3/uL — ABNORMAL LOW (ref 4.0–10.5)

## 2018-04-09 MED ORDER — MORPHINE SULFATE (PF) 2 MG/ML IV SOLN: 2.0000 mg | INTRAVENOUS | Status: DC | PRN

## 2018-04-09 MED ORDER — ACETAMINOPHEN 325 MG PO TABS: 650.0000 mg | ORAL_TABLET | ORAL | Status: DC | PRN

## 2018-04-09 MED ORDER — ASPIRIN 81 MG PO CHEW
324.0000 mg | CHEWABLE_TABLET | Freq: Once | ORAL | Status: AC
  Administered 2018-04-09: 324 mg via ORAL
  Filled 2018-04-09: qty 4

## 2018-04-09 MED ORDER — POTASSIUM CHLORIDE CRYS ER 20 MEQ PO TBCR
40.0000 meq | EXTENDED_RELEASE_TABLET | ORAL | Status: AC
  Administered 2018-04-10: 40 meq via ORAL
  Filled 2018-04-09: qty 2

## 2018-04-09 MED ORDER — NITROGLYCERIN 0.4 MG SL SUBL: 0.4000 mg | SUBLINGUAL_TABLET | SUBLINGUAL | Status: DC | PRN

## 2018-04-09 MED ORDER — GI COCKTAIL ~~LOC~~: 30.0000 mL | Freq: Four times a day (QID) | ORAL | Status: DC | PRN

## 2018-04-09 MED ORDER — ONDANSETRON HCL 4 MG/2ML IJ SOLN: 4.0000 mg | Freq: Four times a day (QID) | INTRAMUSCULAR | Status: DC | PRN

## 2018-04-09 MED ORDER — ENOXAPARIN SODIUM 40 MG/0.4ML ~~LOC~~ SOLN
40.0000 mg | Freq: Every day | SUBCUTANEOUS | Status: DC
  Administered 2018-04-10 (×2): 40 mg via SUBCUTANEOUS
  Filled 2018-04-09 (×2): qty 0.4

## 2018-04-09 NOTE — ED Notes (Signed)
Pt ambulated to bathroom, noted to have steady gait, no difficulty.

## 2018-04-09 NOTE — ED Triage Notes (Signed)
Reports L sided chest pain that started yesterday and radiated down his L arm.  States he then had symptoms on R side  .  Also reports sob.  Denies nausea and vomiting.  Denies pain at present but reports he has stiffness.

## 2018-04-09 NOTE — ED Provider Notes (Signed)
Patient placed in Quick Look pathway, seen and evaluated   Chief Complaint: chest pain and shortness of breath  HPI:   Erik Frederick is a 62 y.o. male with hx of HIV and  HTN, presents to the ED with c/o chest pain and shortness of breath that occurred yesterday while at work. 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 Patient reports that the pain started in the left chest and went down his arm to the elbow and then later had pain in the right arm. Patient denies chest pain at this time just stiff feeling in the left arm. Patient does report working outside a few days ago and drinking alcohol.   ROS: C/V: chest pain, shortness of breath  Physical Exam:  BP (!) 192/72 (BP Location: Right Arm)   Pulse (!) 57   Temp 98.2 F (36.8 C) (Oral)   Resp 14   SpO2 99%    Gen: No distress  Neuro: Awake and Alert  Skin: Warm and dry  Heart: regular rate and rhythm  Lungs: no wheezing or rales.    Initiation of care has begun. The patient has been counseled on the process, plan, and necessity for staying for the completion/evaluation, and the remainder of the medical screening examination    Janne Napoleoneese, Glor[place] Stroope M, NP 04/09/18 2001    Tilden Fossaees, Elizabeth, MD 04/10/18 [id number]

## 2018-04-09 NOTE — H&P (Signed)
History and Physical    TAYM TWIST GNF:[id number] DOB: [birth year] DOA: 04/09/2018  Referring MD/NP/PA: Sharilyn Sites, PA-C PCP: Georgann Housekeeper, MD  Patient coming from: Home   Chief Complaint: Chest pain  I have personally briefly reviewed patient's old medical records in [place] [facility]   HPI: Erik Frederick is a 62 y.o. male with medical history significant of HTN, HLD, HIV, cirrhosis, hepatitis C, anxiety, and depression; who presents with complaints of chest pain.  Symptoms started 2 days ago after patient had been outside in his yard pulling weeds.  Describes a tightness/pressure feeling substernal radiated to the towards his left shoulder down to his elbow.  He initially thought that he had overworked his self, but symptoms progressively worsen to the point which he reports having shortness of breath and diaphoresis with exertion.  Pain thereafter noted to be felt across his entire chest.  He notes some mild tenderness to palpation previously.  Symptoms resolved with rest and he also noted improvement after taking aspirin.  Denies any significant leg swelling, weight gain, orthopnea, palpitations, nausea, vomiting, dysuria, or diarrhea.  Family history is significant for his mother having heart issues at the age of 22.  Patient currently does not smoke cigarettes, but continues to vape.  ED Course: Admission to the emergency department patient was seen to be afebrile, pulse 48-57, blood pressure elevated up to 192/72, and all other vital signs maintained.  Notable labs included WBC 3.7, potassium 3.3, and I-stat troponin 0.08. CXR revealed cardiomegaly with pulmonary vascular congestion.  EKG showing sinus tachycardia with worsening signs of LVH.  Heart score was reported to be 6.  Patient was given 324 mg of aspirin.  TRH called to admit  Review of Systems  Constitutional: Positive for diaphoresis and weight loss (Intentional loss with diet). Negative for chills and fever.  HENT:  Negative for congestion and nosebleeds.   Eyes: Negative for photophobia and pain.  Respiratory: Positive for shortness of breath and wheezing. Negative for cough.   Cardiovascular: Positive for chest pain. Negative for orthopnea and leg swelling.  Gastrointestinal: Negative for abdominal pain, diarrhea and vomiting.  Genitourinary: Negative for dysuria and hematuria.  Musculoskeletal: Positive for myalgias. Negative for joint pain.  Skin: Negative for itching.  Neurological: Negative for focal weakness and loss of consciousness.  Psychiatric/Behavioral: Negative for memory loss. The patient is nervous/anxious.     Past Medical History:  Diagnosis Date  . Anxiety   . Cirrhosis (HCC)   . Depression   . GERD (gastroesophageal reflux disease)   . Hepatitis C   . High cholesterol   . HIV infection (HCC)   . Hypertension   . Insomnia   . Osteoarthritis   . Pneumonia     Past Surgical History:  Procedure Laterality Date  . CHOLECYSTECTOMY       reports that he has been smoking cigarettes and e-cigarettes. He started smoking about 5 years ago. He has been smoking about 0.25 packs per day. He has never used smokeless tobacco. He reports that he drinks about 21.0 standard drinks of alcohol per week. He reports that he does not use drugs.  No Known Allergies  Family History  Problem Relation Age of Onset  . Heart disease Mother   . Diabetes Mother 33  . Gallstones Father   . Alzheimer's disease Father        Dementia  . Breast cancer Sister     Prior to Admission medications   Medication Sig Start  Date End Date Taking? Authorizing Provider  bictegravir-emtricitabine-tenofovir AF (BIKTARVY) 50-200-25 MG TABS tablet Take 1 tablet by mouth daily. 12/18/17   Comer, Belia Heman, MD  carvedilol (COREG) 25 MG tablet Take 25 mg by mouth 2 (two) times daily with a meal.    [provider]  diphenoxylate-atropine (LOMOTIL) 2.5-0.025 MG tablet Take 1 tablet by mouth 4 (four) times  daily as needed for diarrhea or loose stools. Patient not taking: Reported on 02/03/2018 04/23/16   Gardiner Barefoot, MD  FLUoxetine (PROZAC) 40 MG capsule Take 1 capsule (40 mg total) by mouth daily. 03/12/12   Gardiner Barefoot, MD  Loperamide HCl (IMODIUM A-D PO) Take by mouth. Takes when has to go to work, approx twice weekly    [provider]  LORazepam (ATIVAN) 1 MG tablet Take 1 mg by mouth at bedtime.    [provider]  typhoid (VIVOTIF) DR capsule Take 1 capsule by mouth every other day. Keep refrigerated 10/08/17   Gardiner Barefoot, MD  valsartan-hydrochlorothiazide (DIOVAN-HCT) 160-25 MG tablet Take 1 tablet by mouth daily.    [provider]    Physical Exam:  Constitutional: Obese male NAD, calm, comfortable Vitals:   04/09/18 1951 04/09/18 2230 04/09/18 2300  BP: (!) 192/72 (!) 152/66 (!) 161/68  Pulse: (!) 57 (!) 56 (!) 48  Resp: 14 16 17   Temp: 98.2 F (36.8 C)    TempSrc: Oral    SpO2: 99% 98% 100%   Eyes: PERRL, lids and conjunctivae normal ENMT: Mucous membranes are moist. Posterior pharynx clear of any exudate or lesions. Normal dentition.  Neck: normal, supple, no masses, no thyromegaly Respiratory: clear to auscultation bilaterally, no wheezing, no crackles. Normal respiratory effort. No accessory muscle use.  No tenderness to palpation at this time. Cardiovascular: Bradycardic, no murmurs / rubs / gallops.  Trace extremity edema. 2+ pedal pulses. No carotid bruits.  Abdomen: no tenderness, no masses palpated. No hepatosplenomegaly. Bowel sounds positive.  Musculoskeletal: no clubbing / cyanosis. No joint deformity upper and lower extremities. Good ROM, no contractures. Normal muscle tone.  Skin: no rashes, lesions, ulcers. No induration Neurologic: CN 2-12 grossly intact. Sensation intact, DTR normal. Strength 5/5 in all 4.  Psychiatric: Normal judgment and insight. Alert and oriented x 3. Normal mood.     Labs on Admission: I have  personally reviewed following labs and imaging studies  CBC: Recent Labs  Lab 04/09/18 2007  WBC 3.7*  HGB 14.1  HCT 41.4  MCV 99.0  PLT 204   Basic Metabolic Panel: Recent Labs  Lab 04/09/18 2007  NA 140  K 3.3*  CL 104  CO2 25  GLUCOSE 139*  BUN 12  CREATININE 1.14  CALCIUM 9.7   GFR: CrCl cannot be calculated (Unknown ideal weight.). Liver Function Tests: No results for input(s): AST, ALT, ALKPHOS, BILITOT, PROT, ALBUMIN in the last 168 hours. No results for input(s): LIPASE, AMYLASE in the last 168 hours. No results for input(s): AMMONIA in the last 168 hours. Coagulation Profile: No results for input(s): INR, PROTIME in the last 168 hours. Cardiac Enzymes: No results for input(s): CKTOTAL, CKMB, CKMBINDEX, TROPONINI in the last 168 hours. BNP (last 3 results) No results for input(s): PROBNP in the last 8760 hours. HbA1C: No results for input(s): HGBA1C in the last 72 hours. CBG: No results for input(s): GLUCAP in the last 168 hours. Lipid Profile: No results for input(s): CHOL, HDL, LDLCALC, TRIG, CHOLHDL, LDLDIRECT in the last 72 hours. Thyroid Function  Tests: No results for input(s): TSH, T4TOTAL, FREET4, T3FREE, THYROIDAB in the last 72 hours. Anemia Panel: No results for input(s): VITAMINB12, FOLATE, FERRITIN, TIBC, IRON, RETICCTPCT in the last 72 hours. Urine analysis:    Component Value Date/Time   COLORURINE ORANGE (A) 04/06/2011 1432   APPEARANCEUR CLEAR 04/06/2011 1432   LABSPEC 1.022 04/06/2011 1432   PHURINE 5.5 04/06/2011 1432   GLUCOSEU NEG 04/06/2011 1432   HGBUR NEG 04/06/2011 1432   BILIRUBINUR SMALL (A) 04/06/2011 1432   KETONESUR NEG 04/06/2011 1432   PROTEINUR NEG 04/06/2011 1432   UROBILINOGEN 1 04/06/2011 1432   NITRITE NEG 04/06/2011 1432   LEUKOCYTESUR NEG 04/06/2011 1432   Sepsis Labs: No results found for this or any previous visit (from the past 240 hour(s)).   Radiological Exams on Admission: Dg Chest 2 View  Result  Date: 04/09/2018 CLINICAL DATA:  LEFT chest pain beginning yesterday, shortness of breath. History of HIV, smoker and cirrhosis. EXAM: CHEST - 2 VIEW COMPARISON:  Chest radiograph July 15, 2006 FINDINGS: Cardiac silhouette is mildly enlarged. RIGHT paraspinal rounded density new from prior radiograph, not localized on lateral radiograph. Mediastinal silhouette is not suspicious. Bronchitic changes without pleural effusion or focal consolidation. No pneumothorax. Mild degenerative changes thoracic spine. Surgical clips in the included right abdomen compatible with cholecystectomy. IMPRESSION: 1. Cardiomegaly and pulmonary vascular congestion. 2. Mild bronchitic changes without focal consolidation. 3. Paraspinal rounded density, favoring confluence of shadows though mass not excluded. Recommend close attention on follow-up imaging or, non emergent CT chest with contrast. Electronically Signed   By: Awilda Metro M.D.   On: 04/09/2018 21:28    EKG: Independently reviewed.  Sinus bradycardia 57 bpm with first-degree heart block and LVH  Assessment/Plan Chest pain: Acute.  Presents with reports of substernal chest pain with radiation to the left side, shortness of breath, and diaphoresis.  Initial troponin negative.  EKG with bradycardia with first-degree heart block, and signs of LVH.  Risk factors include positive family history, obesity, HIV medications - Admit to a telemetry bed - Continue to trend cardiac troponins - Add-on d-dimer  BNP if able - Check lipid panel in a.m. - Check echocardiogram - Message sent for cardiology to eval in a.m.  Leukopenia: Initial WBC 3.7.  Patient denies any other infectious symptoms. - Recheck CBC in a.m.  Hypertensive urgency: On admission blood pressures elevated up to 192/72. - Continue Diovan - Held Coreg for possible need of stress testing  Bradycardia: Heart rates 48-57 despite missing nighttime Coreg dose.   Hypokalemia: Acute.  Initial potassium  3.3 on admission.  Symptoms likely related with diuretic. - Give 40 mEq of potassium chloride x1 dose now - Continue to monitor and replace as needed  HIV: Last CD4 count 350 with viral load noted to be undetectable on 02/09/2018. - Continue current home medication Biktarvy - Continue outpatient treatment with infectious disease  History of hyperlipidemia: Patient currently not on any medication.  Hepatitis C, history of hepatic cirrhosis: Compensated likely secondary to history of hepatitis C and alcohol use.  Patient previously treated with Harvoni for hepatitis C genotype 1b and cured.  Anxiety/depression - Continue Prozac and Ativan at bedtime  Obesity: BMI 35.76  Vape user - Counseled patient on the need ofcessation of nicotine   DVT prophylaxis: lovenox Code Status: Full  Family Communication: No family present at bedside Disposition Plan: Likely discharge home once medically stable Consults called: none  Admission status: Observation  Clydie Braun MD [facility] Pager  [phone]   If 7PM-7AM, please contact night-coverage [email or web address] Password [email or web address]  04/09/2018, 11:08 PM

## 2018-04-09 NOTE — ED Provider Notes (Addendum)
[facility] EMERGENCY DEPARTMENT Provider Note   CSN: [id number] Arrival date & time: 04/09/18  1945     History   Chief Complaint Chief Complaint  Patient presents with  . Chest Pain    HPI Erik Frederick is a 62 y.o. male.  The history is provided by the patient and medical records.   62 y.o. M with hx of anxiety, cirrhosis, depression, GERD, Hep C, HIV with last CD4 of 350 in June 2019, hypertension, hyperlipidemia, osteoarthritis, presenting to the ED with chest pain.  Reports over the past 2 days he has noticed a new exertional chest pain.  States he did have some pain in his left arm upon waking yesterday, attributed this to sleeping with his left arm at an awkward angle.  States once he got moving it started feeling a little bit better and he went on to work as normal.  He works as a Leisure centre manager states he was doing some tasks around his patient's house (taking out garbage, etc) and started to get very short of breath.  States he went to [facility] for her to run some errands and by the time he got to the back of the store he had worsening chest pain described as a tightness, shortness of breath, lightheadedness, and was sweating.  States he stopped and rested for about 5 or 10 minutes and started feeling better.  States the same thing happened when he was outside trying to do some simple yard work.  He has no known cardiac history.  Reports he had a stress test 10 or more years ago that was normal at that time.  He is not followed regularly with a cardiologist.  He has stopped smoking cigarettes, now vapes.  Mother did have significant cardiac disease and multiple MIs.  He took full dose ASA yesterday and it seemed to help, did not take any today.  Patient denies any active chest pain currently, reports some soreness in his left arm/posterior shoulder.  Past Medical History:  Diagnosis Date  . Anxiety   . Cirrhosis (HCC)   . Depression   . GERD  (gastroesophageal reflux disease)   . Hepatitis C   . High cholesterol   . HIV infection (HCC)   . Hypertension   . Insomnia   . Osteoarthritis   . Pneumonia     Patient Active Problem List   Diagnosis Date Noted  . Pneumococcal 7-valent conjugate vaccine administered 02/03/2018  . Travel advice encounter 10/08/2017  . Grief reaction 07/08/2017  . HTN (hypertension) 04/10/2016  . Screening examination for venereal disease 07/27/2014  . Encounter for long-term (current) use of medications 07/27/2014  . Hepatic cirrhosis (HCC) 02/02/2014  . Hepatitis C virus infection without hepatic coma 05/12/2013  . Tobacco abuse 05/12/2013  . Diarrhea 12/30/2012  . Insomnia 02/23/2011  . OSTEOARTHRITIS, GENERALIZED 09/11/2010  . HAND PAIN, LEFT 11/14/2009  . PERIPHERAL EDEMA 10/20/2009  . Human immunodeficiency virus (HIV) disease (HCC) 03/24/2007  . GERD 03/24/2007  . CHOLELITHIASIS 03/24/2007    Past Surgical History:  Procedure Laterality Date  . CHOLECYSTECTOMY          Home Medications    Prior to Admission medications   Medication Sig Start Date End Date Taking? Authorizing Provider  bictegravir-emtricitabine-tenofovir AF (BIKTARVY) 50-200-25 MG TABS tablet Take 1 tablet by mouth daily. 12/18/17   Comer, Belia Heman, MD  carvedilol (COREG) 25 MG tablet Take 25 mg by mouth 2 (two) times daily with a  meal.    [provider]  diphenoxylate-atropine (LOMOTIL) 2.5-0.025 MG tablet Take 1 tablet by mouth 4 (four) times daily as needed for diarrhea or loose stools. Patient not taking: Reported on 02/03/2018 04/23/16   Gardiner Barefoot, MD  FLUoxetine (PROZAC) 40 MG capsule Take 1 capsule (40 mg total) by mouth daily. 03/12/12   Gardiner Barefoot, MD  Loperamide HCl (IMODIUM A-D PO) Take by mouth. Takes when has to go to work, approx twice weekly    [provider]  LORazepam (ATIVAN) 1 MG tablet Take 1 mg by mouth at bedtime.    [provider]  typhoid (VIVOTIF) DR  capsule Take 1 capsule by mouth every other day. Keep refrigerated 10/08/17   Gardiner Barefoot, MD  valsartan-hydrochlorothiazide (DIOVAN-HCT) 160-25 MG tablet Take 1 tablet by mouth daily.    [provider]    Family History Family History  Problem Relation Age of Onset  . Heart disease Mother   . Diabetes Mother 50  . Gallstones Father   . Alzheimer's disease Father        Dementia  . Breast cancer Sister     Social History Social History   Tobacco Use  . Smoking status: Current Every Day Smoker    Packs/day: 0.25    Types: Cigarettes, E-cigarettes    Start date: 01/11/2013    Last attempt to quit: 02/25/2016    Years since quitting: 2.1  . Smokeless tobacco: Never Used  . Tobacco comment: states he is switching to vape pen currently  Substance Use Topics  . Alcohol use: Yes    Alcohol/week: 21.0 standard drinks    Types: 21 Glasses of wine per week    Comment: beer  . Drug use: No     Allergies   Patient has no known allergies.   Review of Systems Review of Systems  Respiratory: Positive for shortness of breath.   Cardiovascular: Positive for chest pain.  Musculoskeletal: Positive for arthralgias.  All other systems reviewed and are negative.    Physical Exam Updated Vital Signs BP (!) 192/72 (BP Location: Right Arm)   Pulse (!) 57   Temp 98.2 F (36.8 C) (Oral)   Resp 14   SpO2 99%   Physical Exam  Constitutional: He is oriented to person, place, and time. He appears well-developed and well-nourished.  HENT:  Head: Normocephalic and atraumatic.  Mouth/Throat: Oropharynx is clear and moist.  Eyes: Pupils are equal, round, and reactive to light. Conjunctivae and EOM are normal.  Neck: Normal range of motion.  Cardiovascular: Normal rate, regular rhythm and normal heart sounds.  Pulmonary/Chest: Effort normal and breath sounds normal. He has no decreased breath sounds. He has no wheezes. He has no rhonchi.  Abdominal: Soft. Bowel sounds are  normal.  Musculoskeletal: Normal range of motion.  No peripheral edema; no calf tenderness or swelling, no overlying skin changes Some mild pain with deep palpation along the deltoid; no tenderness or spasm of the posterior shoulder, full PROM  Neurological: He is alert and oriented to person, place, and time.  Skin: Skin is warm and dry.  Psychiatric: He has a normal mood and affect.  Nursing note and vitals reviewed.    ED Treatments / Results  Labs (all labs ordered are listed, but only abnormal results are displayed) Labs Reviewed  BASIC METABOLIC PANEL - Abnormal; Notable for the following components:      Result Value   Potassium 3.3 (*)    Glucose,  Bld 139 (*)    All other components within normal limits  CBC - Abnormal; Notable for the following components:   WBC 3.7 (*)    RBC 4.18 (*)    All other components within normal limits  BRAIN NATRIURETIC PEPTIDE  TROPONIN I  I-STAT TROPONIN, ED    EKG None  Radiology Dg Chest 2 View  Result Date: 04/09/2018 CLINICAL DATA:  LEFT chest pain beginning yesterday, shortness of breath. History of HIV, smoker and cirrhosis. EXAM: CHEST - 2 VIEW COMPARISON:  Chest radiograph July 15, 2006 FINDINGS: Cardiac silhouette is mildly enlarged. RIGHT paraspinal rounded density new from prior radiograph, not localized on lateral radiograph. Mediastinal silhouette is not suspicious. Bronchitic changes without pleural effusion or focal consolidation. No pneumothorax. Mild degenerative changes thoracic spine. Surgical clips in the included right abdomen compatible with cholecystectomy. IMPRESSION: 1. Cardiomegaly and pulmonary vascular congestion. 2. Mild bronchitic changes without focal consolidation. 3. Paraspinal rounded density, favoring confluence of shadows though mass not excluded. Recommend close attention on follow-up imaging or, non emergent CT chest with contrast. Electronically Signed   By: Awilda Metroourtnay  Bloomer M.D.   On: 04/09/2018  21:28    Procedures Procedures (including critical care time)  Medications Ordered in ED Medications  aspirin chewable tablet 324 mg (324 mg Oral Given 04/09/18 2245)     Initial Impression / Assessment and Plan / ED Course  I have reviewed the triage vital signs and the nursing notes.  Pertinent labs & imaging results that were available during my care of the patient were reviewed by me and considered in my medical decision making (see chart for details).  62 y.o. M here with new exertional chest pain over the past 2 days, has associated SOB, diaphoresis, lightheadedness when pain occurs.  HEART score of 6.   EKG with progression of LVH when compared with prior.  CXR with some vascular congestion, however patient does not appears clinically fluid overloaded.  Labs overall reassuring, troponin at upper limit of normal at 0.08.  No active chest pain at present.  Given full dose ASA.  Will admit for chest pain rule out.  Discussed with hospitalist, Dr. Katrinka BlazingSmith-- he will admit for ongoing care.  Final Clinical Impressions(s) / ED Diagnoses   Final diagnoses:  Exertional chest pain    ED Discharge Orders    None       Garlon HatchetSanders, Logyn Kendrick M, PA-C 04/09/18 2337    Garlon HatchetSanders, Fardowsa Authier M, PA-C 04/10/18 0411    Tilden Fossaees, Elizabeth, MD 04/10/18 [phone]

## 2018-04-10 ENCOUNTER — Observation Stay (HOSPITAL_BASED_OUTPATIENT_CLINIC_OR_DEPARTMENT_OTHER): Payer: Medicare Other

## 2018-04-10 ENCOUNTER — Other Ambulatory Visit: Payer: Self-pay

## 2018-04-10 DIAGNOSIS — F329 Major depressive disorder, single episode, unspecified: Secondary | ICD-10-CM

## 2018-04-10 DIAGNOSIS — B1920 Unspecified viral hepatitis C without hepatic coma: Secondary | ICD-10-CM | POA: Diagnosis not present

## 2018-04-10 DIAGNOSIS — F419 Anxiety disorder, unspecified: Secondary | ICD-10-CM | POA: Diagnosis present

## 2018-04-10 DIAGNOSIS — K7460 Unspecified cirrhosis of liver: Secondary | ICD-10-CM | POA: Diagnosis not present

## 2018-04-10 DIAGNOSIS — F32A Depression, unspecified: Secondary | ICD-10-CM | POA: Diagnosis present

## 2018-04-10 DIAGNOSIS — B20 Human immunodeficiency virus [HIV] disease: Secondary | ICD-10-CM

## 2018-04-10 DIAGNOSIS — I200 Unstable angina: Secondary | ICD-10-CM | POA: Diagnosis not present

## 2018-04-10 DIAGNOSIS — I361 Nonrheumatic tricuspid (valve) insufficiency: Secondary | ICD-10-CM

## 2018-04-10 DIAGNOSIS — I160 Hypertensive urgency: Secondary | ICD-10-CM

## 2018-04-10 DIAGNOSIS — D72819 Decreased white blood cell count, unspecified: Secondary | ICD-10-CM | POA: Diagnosis present

## 2018-04-10 DIAGNOSIS — I517 Cardiomegaly: Secondary | ICD-10-CM

## 2018-04-10 DIAGNOSIS — E876 Hypokalemia: Secondary | ICD-10-CM

## 2018-04-10 DIAGNOSIS — B182 Chronic viral hepatitis C: Secondary | ICD-10-CM

## 2018-04-10 DIAGNOSIS — R0789 Other chest pain: Secondary | ICD-10-CM | POA: Diagnosis not present

## 2018-04-10 LAB — LIPID PANEL
Cholesterol: 130 mg/dL (ref 0–200)
HDL: 46 mg/dL (ref >40)
LDL Cholesterol: 74 mg/dL (ref 0–99)
Total CHOL/HDL Ratio: 2.8 RATIO
Triglycerides: 50 mg/dL (ref <150)
VLDL: 10 mg/dL (ref 0–40)

## 2018-04-10 LAB — NM MYOCAR MULTI W/SPECT W/WALL MOTION / EF
Peak HR: 85 {beats}/min
Rest HR: 52 {beats}/min

## 2018-04-10 LAB — BRAIN NATRIURETIC PEPTIDE: B Natriuretic Peptide: 26.1 pg/mL (ref 0.0–100.0)

## 2018-04-10 LAB — TROPONIN I
Troponin I: 0.06 ng/mL (ref <0.03)
Troponin I: 0.05 ng/mL (ref <0.03)
Troponin I: 0.06 ng/mL (ref <0.03)

## 2018-04-10 LAB — CBC WITH DIFFERENTIAL/PLATELET
Abs Immature Granulocytes: 0 10*3/uL (ref 0.0–0.1)
Basophils Absolute: 0 10*3/uL (ref 0.0–0.1)
Basophils Relative: 1 %
Eosinophils Absolute: 0.1 10*3/uL (ref 0.0–0.7)
Eosinophils Relative: 3 %
HCT: 40 % (ref 39.0–52.0)
Hemoglobin: 13.6 g/dL (ref 13.0–17.0)
Immature Granulocytes: 0 %
Lymphocytes Relative: 40 %
Lymphs Abs: 1.6 10*3/uL (ref 0.7–4.0)
MCH: 34.4 pg — ABNORMAL HIGH (ref 26.0–34.0)
MCHC: 34 g/dL (ref 30.0–36.0)
MCV: 101.3 fL — ABNORMAL HIGH (ref 78.0–100.0)
Monocytes Absolute: 0.5 10*3/uL (ref 0.1–1.0)
Monocytes Relative: 13 %
Neutro Abs: 1.8 10*3/uL (ref 1.7–7.7)
Neutrophils Relative %: 43 %
Platelets: 170 10*3/uL (ref 150–400)
RBC: 3.95 MIL/uL — ABNORMAL LOW (ref 4.22–5.81)
RDW: 12.5 % (ref 11.5–15.5)
WBC: 4 10*3/uL (ref 4.0–10.5)

## 2018-04-10 LAB — BASIC METABOLIC PANEL
Anion gap: 11 (ref 5–15)
BUN: 14 mg/dL (ref 8–23)
CO2: 23 mmol/L (ref 22–32)
Calcium: 9.1 mg/dL (ref 8.9–10.3)
Chloride: 105 mmol/L (ref 98–111)
Creatinine, Ser: 1.14 mg/dL (ref 0.61–1.24)
GFR calc Af Amer: >60 mL/min (ref >60)
GFR calc non Af Amer: >60 mL/min (ref >60)
Glucose, Bld: 104 mg/dL — ABNORMAL HIGH (ref 70–99)
Potassium: 3.4 mmol/L — ABNORMAL LOW (ref 3.5–5.1)
Sodium: 139 mmol/L (ref 135–145)

## 2018-04-10 LAB — ECHOCARDIOGRAM COMPLETE
Height: 71 in
Weight: 4102.32 oz

## 2018-04-10 LAB — D-DIMER, QUANTITATIVE: D-Dimer, Quant: <0.27 ug/mL-FEU (ref 0.00–0.50)

## 2018-04-10 LAB — MAGNESIUM: Magnesium: 2 mg/dL (ref 1.7–2.4)

## 2018-04-10 MED ORDER — REGADENOSON 0.4 MG/5ML IV SOLN
INTRAVENOUS | Status: AC
  Administered 2018-04-10: 0.4 mg
  Filled 2018-04-10: qty 5

## 2018-04-10 MED ORDER — FLUOXETINE HCL 20 MG PO CAPS
40.0000 mg | ORAL_CAPSULE | Freq: Every day | ORAL | Status: DC
  Administered 2018-04-10 – 2018-04-11 (×3): 40 mg via ORAL
  Filled 2018-04-10 (×4): qty 2

## 2018-04-10 MED ORDER — ISOSORBIDE MONONITRATE ER 30 MG PO TB24: 30.0000 mg | ORAL_TABLET | Freq: Every day | ORAL | Status: DC

## 2018-04-10 MED ORDER — CARVEDILOL 25 MG PO TABS
25.0000 mg | ORAL_TABLET | Freq: Two times a day (BID) | ORAL | Status: DC
  Administered 2018-04-10 – 2018-04-11 (×4): 25 mg via ORAL
  Filled 2018-04-10 (×3): qty 1

## 2018-04-10 MED ORDER — HYDRALAZINE HCL 20 MG/ML IJ SOLN: 10.0000 mg | INTRAMUSCULAR | Status: DC | PRN

## 2018-04-10 MED ORDER — VALSARTAN-HYDROCHLOROTHIAZIDE 160-25 MG PO TABS: 1.0000 | ORAL_TABLET | Freq: Every day | ORAL | Status: DC

## 2018-04-10 MED ORDER — REGADENOSON 0.4 MG/5ML IV SOLN
0.4000 mg | Freq: Once | INTRAVENOUS | Status: DC
  Filled 2018-04-10: qty 5

## 2018-04-10 MED ORDER — TECHNETIUM TC 99M TETROFOSMIN IV KIT
30.0000 | PACK | Freq: Once | INTRAVENOUS | Status: AC | PRN
  Administered 2018-04-10: 30 via INTRAVENOUS

## 2018-04-10 MED ORDER — LORAZEPAM 1 MG PO TABS
1.0000 mg | ORAL_TABLET | Freq: Every day | ORAL | Status: DC
  Administered 2018-04-10 (×2): 1 mg via ORAL
  Filled 2018-04-10 (×2): qty 1

## 2018-04-10 MED ORDER — ISOSORBIDE MONONITRATE ER 30 MG PO TB24
30.0000 mg | ORAL_TABLET | Freq: Every day | ORAL | Status: DC
  Administered 2018-04-10: 30 mg via ORAL
  Filled 2018-04-10 (×2): qty 1

## 2018-04-10 MED ORDER — IRBESARTAN 150 MG PO TABS
150.0000 mg | ORAL_TABLET | Freq: Every day | ORAL | Status: DC
  Administered 2018-04-10 – 2018-04-11 (×2): 150 mg via ORAL
  Filled 2018-04-10 (×2): qty 1

## 2018-04-10 MED ORDER — TECHNETIUM TC 99M TETROFOSMIN IV KIT
10.0000 | PACK | Freq: Once | INTRAVENOUS | Status: AC | PRN
  Administered 2018-04-10: 10 via INTRAVENOUS

## 2018-04-10 MED ORDER — HYDROCHLOROTHIAZIDE 25 MG PO TABS
25.0000 mg | ORAL_TABLET | Freq: Every day | ORAL | Status: DC
  Administered 2018-04-10 – 2018-04-11 (×2): 25 mg via ORAL
  Filled 2018-04-10 (×2): qty 1

## 2018-04-10 MED ORDER — BICTEGRAVIR-EMTRICITAB-TENOFOV 50-200-25 MG PO TABS
1.0000 | ORAL_TABLET | Freq: Every day | ORAL | Status: DC
  Administered 2018-04-10 – 2018-04-11 (×2): 1 via ORAL
  Filled 2018-04-10 (×2): qty 1

## 2018-04-10 MED ORDER — ASPIRIN EC 81 MG PO TBEC
81.0000 mg | DELAYED_RELEASE_TABLET | Freq: Every day | ORAL | Status: DC
  Administered 2018-04-10 – 2018-04-11 (×2): 81 mg via ORAL
  Filled 2018-04-10 (×2): qty 1

## 2018-04-10 NOTE — Progress Notes (Signed)
CARDIOLOGY FOLLOW UP  Cardiac results:  Myocardial perfusion imaging demonstrates anterolateral ischemia, EF 51%, and risk stratification is high.  Echocardiogram demonstrated moderate LVH with preserved function.  Recommendations:   Diagnostic coronary angiography --> spent significant time discussing the advantages of angiography with the patient.  At this time he prefers to try medical therapy.  I recommended that he ambulate in the hall on his current medical regimen to determine if angina is suppressed.  If not he will need to reconsider coronary angiography.  Needs to be set up for clinical follow-up in 1 to 2 weeks.  He takes a statin at home but cannot remember the name (this explains his LDL of 74).  Aspirin 81 mg/day.

## 2018-04-10 NOTE — Progress Notes (Signed)
  Echocardiogram 2D Echocardiogram has been performed.  Erik Frederick L Androw 04/10/2018, 10:21 AM

## 2018-04-10 NOTE — Consult Note (Addendum)
The patient has been seen in conjunction with Westley Foots, PA-C. All aspects of care have been considered and discussed. The patient has been personally interviewed, examined, and all clinical data has been reviewed.   Presentation is concerning for new onset angina.  Risk factors include hypertension, male sex, age, and HIV.  Exam is normal.  EKG suggests left ventricular hypertrophy with strain pattern.  Cardiac markers are negative x3.  Suspect underlying CAD although could be having angina associated with left ventricular hypertrophy and poor cardiac vasodilatory reserve.  Recommended coronary angiography but the patient was reluctant to proceed after discussing the procedure and risks.   Continue current antihypertensive regimen including carvedilol 25 mg twice daily.  Add long-acting nitrates in the form of Imdur 30 to 60 mg/day.  Moderate high intensity statin therapy, although LDL was 74 on admission.Marland Kitchen  Pharmacologic myocardial perfusion imaging to exclude high risk subset.  If moderate or high risk study will need coronary angiography.  Continue cycling cardiac markers.  If perfusion study is low risk, he must be able to ambulate briskly without recurrent chest discomfort before he will be safe to discharge patient.  Cardiology Consult    Patient ID: ROMYN BOSWELL MRN: [id number], DOB/AGE: [birth year]   Admit date: 04/09/2018 Date of Consult: 04/10/2018  Primary Physician: Georgann Housekeeper, MD Primary Cardiologist:  New (Dr. Katrinka Blazing) Requesting Provider: Dr. Allyne Gee  Patient Profile    Erik Frederick is a 62 y.o. male with a history of HTN (on valsartan-HCTZ, HLD (not on statin d/t cirrhosis, LDL 73), HIV (01/2018 CD4 350), hepatitis C, liver cirrhosis, and h/o anxiety and depression who is being seen today for the evaluation of chest pain at the request of Dr. Allyne Gee.  Past Medical History   Past Medical History:  Diagnosis Date  . Anxiety   . Cirrhosis (HCC)   .  Depression   . GERD (gastroesophageal reflux disease)   . Hepatitis C   . High cholesterol   . HIV infection (HCC)   . Hypertension   . Insomnia   . Osteoarthritis   . Pneumonia     Past Surgical History:  Procedure Laterality Date  . CHOLECYSTECTOMY       Allergies  No Known Allergies  History of Present Illness    62 yo male with history as above and no known cardiac history.  Patient reports that on 8/27, he awoke with left arm pain, which he initially attributed to sleeping on his arm at an awkward angle. 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 As the day progressed, he also noted associated symptoms of near syncope (light-headedness) and diaphoresis. The chest tightness and associated SOB reportedly alleviated with 5-1m rest then returned with resumption of activity. He reports at least 3 episodes of SOB and chest tightness that day, alleviated with rest. He also notes he took ASA with some alleviation; however, he decided to present to the ED given he found his sx concerning and has never experienced anything like this in the past.   On 8/28, he presented to [facility] ED with vital signs BP (!) 192/72, HR=57, Temp 98.2 F (36.8 C),  Resp 14, SpO2 99%. Labs were significant for Na 140, K 3.3, glucose 139, Cr 1.14 (baseline 1.08), Ca 9.7, WBC 3.7,  Hgb 14.1, platelets 204. Troponin 0.06  0.05. CXR significant for cardiomegaly and pulmonary vascular congestion, as well as mildly enlarged cardiac silhouette. 8/28 echo results pending. 8/28 EKG NSR bradycardic HR 57 with 1st degree AV block and widening QRS / QTc 457, LVH, and suggestive of repolarization abnormality. In the ED, patient did not report CP but did describe soreness in L arm and scapula region.  On exam today,  the patient does not report chest tightness but does report some neck stiffness and trivial scapular pain, which is different in quality and likely a separate issue from the chest tightness. He denies any associated sx of SOB, DOE, near-syncope sx, syncope sx, palpitations. He does state he has had chronic LEE over the last 2 years, as well as poorly controlled BP. He states he has been making efforts to eat less salt. Also of note, the patient [place].   Inpatient Medications    . bictegravir-emtricitabine-tenofovir AF  1 tablet Oral Daily  . enoxaparin (LOVENOX) injection  40 mg Subcutaneous QHS  . FLUoxetine  40 mg Oral Daily  . irbesartan  150 mg Oral Daily   And  . hydrochlorothiazide  25 mg Oral Daily  . LORazepam  1 mg Oral QHS    Family History    Family History  Problem Relation Age of Onset  . Heart disease Mother   . Diabetes Mother 2770  . Gallstones Father   . Alzheimer's disease Father        Dementia  . Breast cancer Sister    He indicated that his mother is deceased. He indicated that his father is deceased. He indicated that his sister is deceased.   Social History    Social History   Socioeconomic History  . Marital status: Married    Spouse name: Not on file  . Number of children: 4  . Years of education: Not on file  . Highest education level: Not on file  Occupational History  . Occupation: retired  Engineer, productionocial Needs  . Financial resource strain: Not on file  . Food insecurity:    Worry: Not on file    Inability: Not on file  . Transportation needs:    Medical: Not on file    Non-medical: Not on file  Tobacco Use  . Smoking status: Current Every Day Smoker    Packs/day: 0.25    Types: Cigarettes, E-cigarettes    Start date: 01/11/2013    Last attempt to quit: 02/25/2016    Years since quitting: 2.1  . Smokeless tobacco: Never Used  . Tobacco comment: states he is switching to vape pen currently  Substance  and Sexual Activity  . Alcohol use: Yes    Alcohol/week: 21.0 standard drinks    Types: 21 Glasses of wine per week    Comment: beer  . Drug use: No  . Sexual activity: Yes    Partners: Female    Birth control/protection: Condom    Comment: pt. given condoms  Lifestyle  . Physical activity:    Days per week: Not on file    Minutes per session: Not on file  . Stress: Not on file  Relationships  . Social connections:    Talks on phone: Not on file    Gets together: Not on file    Attends religious service: Not on file    Active member of club or organization: Not on file    Attends  meetings of clubs or organizations: Not on file    Relationship status: Not on file  . Intimate partner violence:    Fear of current or ex partner: Not on file    Emotionally abused: Not on file    Physically abused: Not on file    Forced sexual activity: Not on file  Other Topics Concern  . Not on file  Social History Narrative  . Not on file     Review of Systems    General:  No current chills, fever, night sweats or weight changes.  Cardiovascular:  No active chest tigtness, dyspnea on exertion, no edema, orthopnea, palpitations, paroxysmal nocturnal dyspnea. Does note scapular pain and neck pain, likely separate and not cardiovascular as described to be different in quality than that of the chest tightness he had before. Dermatological: No rash, lesions/masses Respiratory: No cough, previous dyspnea Urologic: No hematuria, dysuria Abdominal:   No nausea, vomiting, diarrhea, bright red blood per rectum, melena, or hematemesis Neurologic:  No visual changes, wkns, changes in mental status. All other systems reviewed and are otherwise negative except as noted above.  Physical Exam    Blood pressure (!) 158/74, pulse (!) 53, temperature 98 F (36.7 C), temperature source Oral, resp. rate 20, height 5\' 11"  (1.803 m), weight 116.3 kg, SpO2 98 %.  General: Pleasant, NAD Psych: Normal  affect. Neuro: Alert and oriented X 3. Moves all extremities spontaneously. HEENT: Normal  Neck: Supple without bruits or tenderness to palpation Lungs:  Resp regular and unlabored, crackles heard bilaterally on exam Heart: SR, bradycardic. no s3, s4, or murmurs. Abdomen: Soft, non-tender, non-distended, BS + x 4.  Extremities: No clubbing, cyanosis. 2+ edema. DP/PT/Radials 2+ and equal bilaterally.  Labs    Troponin [facility] Test) Recent Labs    04/09/18 2017  TROPIPOC 0.08   Recent Labs    04/10/18 0010 04/10/18 0224  TROPONINI 0.06* 0.05*   Lab Results  Component Value Date   WBC 4.0 04/10/2018   HGB 13.6 04/10/2018   HCT 40.0 04/10/2018   MCV 101.3 (H) 04/10/2018   PLT 170 04/10/2018    Recent Labs  Lab 04/10/18 0224  NA 139  K 3.4*  CL 105  CO2 23  BUN 14  CREATININE 1.14  CALCIUM 9.1  GLUCOSE 104*   Lab Results  Component Value Date   CHOL 130 04/10/2018   HDL 46 04/10/2018   LDLCALC 74 04/10/2018   TRIG 50 04/10/2018   Lab Results  Component Value Date   DDIMER <0.27 04/10/2018     Radiology Studies    Dg Chest 2 View  Result Date: 04/09/2018 CLINICAL DATA:  LEFT chest pain beginning yesterday, shortness of breath. History of HIV, smoker and cirrhosis. EXAM: CHEST - 2 VIEW COMPARISON:  Chest radiograph July 15, 2006 FINDINGS: Cardiac silhouette is mildly enlarged. RIGHT paraspinal rounded density new from prior radiograph, not localized on lateral radiograph. Mediastinal silhouette is not suspicious. Bronchitic changes without pleural effusion or focal consolidation. No pneumothorax. Mild degenerative changes thoracic spine. Surgical clips in the included right abdomen compatible with cholecystectomy. IMPRESSION: 1. Cardiomegaly and pulmonary vascular congestion. 2. Mild bronchitic changes without focal consolidation. 3. Paraspinal rounded density, favoring confluence of shadows though mass not excluded. Recommend close attention on follow-up  imaging or, non emergent CT chest with contrast. Electronically Signed   By: Awilda Metro M.D.   On: 04/09/2018 21:28    ECG & Cardiac Imaging    8/29 echo pending  Assessment & Plan    Minimally elevated troponin - No chest tightness since admission. No previous h/o cardiac disease. Vapes. HIV. - Troponin minimally elevated as above with peak 0.06. - Unclear etiology at this time and pending further workup - Cannot completely r/o ACS at this time given pt risk factors v. Uncontrolled HTN. - Ddimer negative <0.27; BNP negative 26.1.  - Daily BMET- Renal function at baseline.  - BP elevated - monitor.  - Consider rebound HTN with BB held at admission.  - Restart BB coreg 25mg  BID.   - Continue nitro prn for CP, PRN hydralazine. Continue irbesartan and hctz. - Pending final results of echo. Scheduled for nuclear study today. - EKG with LVH as above in HPI and in setting of uncontrolled HTN - Will plan for addition of imdur 30mg  following nuclear study today - Further ischemic workup pending finalized echo results / nuclear study.  - Tentatively scheduled for cath tomorrow 8/30 with Dr. Tresa Endo and at 2pm, given HPI / risk factors.  Essential Hypertension, poorly controlled - Admission BP 192/72 - systolic remains elevated from 150-190s. - Consider worsening BP d/t rebound HTN since stopped BB at admission. - Restart coreg as above. Continue ARB/HCTZ. Continue hydralazine. - Per IM.  Bradycardia - HR in 50s-60s - Continue to monitor with restart of BB  Hypokalemia, on home diuretic - 3.3 on admission  3.4. Monitor. - Continue to replete with goal 4.0. Daily BMET. - Per IM.  HLD, relatively well controlled, not on statin d/t liver function - 8/29 lipid panel with LDL 74 (goal <70), total 130. - No statin in setting of hepatic cirrhosis / hep C.  HIV - Per IM.  Hepatitis C with h/o hepatic cirrhosis - Per IM.  Vape user - Counseled on cessation.   For questions or  updates, please contact [facility] Please consult [email or web address] for contact info under Cardiology/STEMI.      Lynelle Smoke, PA-C  Pager [phone] 04/10/2018, 7:49 AM

## 2018-04-10 NOTE — Progress Notes (Signed)
    Patient presented for Lexiscan nuclear stress test. Tolerated procedure well. Pending final stress imaging result.  Berton BonJanine Adelbert Gaspard, AGNP-C 04/10/2018  1:11 PM Pager: [phone]

## 2018-04-10 NOTE — Progress Notes (Signed)
   04/10/18 1100  Clinical Encounter Type  Visited With Patient  Visit Type Follow-up;Social support  Referral From Nurse  Consult/Referral To Chaplain  Spiritual Encounters  Spiritual Needs Emotional  Stress Factors  Patient Stress Factors Loss  Chaplain visited with the PT to complete an AD.  The PT was not 100% sure that an AD was something he wanted.  His wife (who has passed some time ago) was [facility].  He is aware especially with this new onset that an AD is something that will be beneficial.  PT will consider the AD and who to assign that power to.

## 2018-04-10 NOTE — Progress Notes (Signed)
PROGRESS NOTE    Erik BeneJeffery A Shuping  BOF:[id number] DOB: 01-22-1956 DOA: 04/09/2018 PCP: Georgann HousekeeperHusain, Karrar, MD    Brief Narrative:  Erik Frederick is a [age] y.o. male with medical history significant of HTN, HLD, HIV, cirrhosis, hepatitis C, anxiety, and depression; who presents with complaints of chest pain.  Symptoms started 2 days ago after patient had been outside in his yard pulling weeds.  Describes a tightness/pressure feeling substernal radiated to the towards his left shoulder down to his elbow. Cardiology consulted and he underwent stress test showing Reversal myocardial perfusion defects in the anterolateral and septal walls, suspicious for inducible myocardial ischemia. High risk study.    Assessment & Plan:   Active Problems:   Human immunodeficiency virus (HIV) disease (HCC)   Hepatitis C virus infection without hepatic coma   Hepatic cirrhosis (HCC)   Chest pain   Anxiety and depression   Hypertensive urgency   Leukopenia   Hypokalemia   New onset angina/ with minimally elevated troponins.  High risk study on stress test.  Will need further evaluation with a cardiac catheterization. Cardiology on board.  Echocardiogram ordered.    Hypertension:  Sub optimally controlled.  Restarted coreg, ARB, hctz and hydralazine.    Hypokalemia: replaced. Repeat in am.    Hyperlipidemia:  No statin in the setting of liver dysfunction    HIV:  Resume Biktarvy.   H/o hep c  Further management as outpatient.       DVT prophylaxis: Lovenox.  Code Status: full code.  Family Communication: none at bedside.  Disposition Plan: pending further evaluation with cardiac cath.    Consultants:   Cardiology.    Procedures:  Stress perfusion study.   Antimicrobials:none.   Subjective: No chest pain at this time.   Objective: Vitals:   04/10/18 0513 04/10/18 1302 04/10/18 1307 04/10/18 1310  BP: (!) 158/74 (!) 148/86 (!) 144/73 (!) 143/73  Pulse: (!) 53       Resp:  16 17 17   Temp: 98 F (36.7 C)     TempSrc: Oral     SpO2: 98%     Weight:      Height:        Intake/Output Summary (Last 24 hours) at 04/10/2018 1417 Last data filed at 04/10/2018 0945 Gross per 24 hour  Intake 100 ml  Output -  Net 100 ml   Filed Weights   04/10/18 0040  Weight: 116.3 kg    Examination:  General exam: Appears calm and comfortable  Respiratory system: Clear to auscultation. Respiratory effort normal. Cardiovascular system: S1 & S2 heard, RRR. No JVD, murmurs Gastrointestinal system: Abdomen is nondistended, soft and nontender. No organomegaly or masses felt. Normal bowel sounds heard. Central nervous system: Alert and oriented. No focal neurological deficits. Extremities: Symmetric 5 x 5 power. Skin: No rashes, lesions or ulcers Psychiatry: Judgement and insight appear normal. Mood & affect appropriate.     Data Reviewed: I have personally reviewed following labs and imaging studies  CBC: Recent Labs  Lab 04/09/18 2007 04/10/18 0224  WBC 3.7* 4.0  NEUTROABS  --  1.8  HGB 14.1 13.6  HCT 41.4 40.0  MCV 99.0 101.3*  PLT 204 170   Basic Metabolic Panel: Recent Labs  Lab 04/09/18 2007 04/10/18 0224  NA 140 139  K 3.3* 3.4*  CL 104 105  CO2 25 23  GLUCOSE 139* 104*  BUN 12 14  CREATININE 1.14 1.14  CALCIUM 9.7 9.1  MG  --  2.0  GFR: Estimated Creatinine Clearance: 86 mL/min (by C-G formula based on SCr of 1.14 mg/dL). Liver Function Tests: No results for input(s): AST, ALT, ALKPHOS, BILITOT, PROT, ALBUMIN in the last 168 hours. No results for input(s): LIPASE, AMYLASE in the last 168 hours. No results for input(s): AMMONIA in the last 168 hours. Coagulation Profile: No results for input(s): INR, PROTIME in the last 168 hours. Cardiac Enzymes: Recent Labs  Lab 04/10/18 0010 04/10/18 0224  TROPONINI 0.06* 0.05*   BNP (last 3 results) No results for input(s): PROBNP in the last 8760 hours. HbA1C: No results for  input(s): HGBA1C in the last 72 hours. CBG: No results for input(s): GLUCAP in the last 168 hours. Lipid Profile: Recent Labs    04/10/18 0224  CHOL 130  HDL 46  LDLCALC 74  TRIG 50  CHOLHDL 2.8   Thyroid Function Tests: No results for input(s): TSH, T4TOTAL, FREET4, T3FREE, THYROIDAB in the last 72 hours. Anemia Panel: No results for input(s): VITAMINB12, FOLATE, FERRITIN, TIBC, IRON, RETICCTPCT in the last 72 hours. Sepsis Labs: No results for input(s): PROCALCITON, LATICACIDVEN in the last 168 hours.  No results found for this or any previous visit (from the past 240 hour(s)).       Radiology Studies: Dg Chest 2 View  Result Date: 04/09/2018 CLINICAL DATA:  LEFT chest pain beginning yesterday, shortness of breath. History of HIV, smoker and cirrhosis. EXAM: CHEST - 2 VIEW COMPARISON:  Chest radiograph July 15, 2006 FINDINGS: Cardiac silhouette is mildly enlarged. RIGHT paraspinal rounded density new from prior radiograph, not localized on lateral radiograph. Mediastinal silhouette is not suspicious. Bronchitic changes without pleural effusion or focal consolidation. No pneumothorax. Mild degenerative changes thoracic spine. Surgical clips in the included right abdomen compatible with cholecystectomy. IMPRESSION: 1. Cardiomegaly and pulmonary vascular congestion. 2. Mild bronchitic changes without focal consolidation. 3. Paraspinal rounded density, favoring confluence of shadows though mass not excluded. Recommend close attention on follow-up imaging or, non emergent CT chest with contrast. Electronically Signed   By: Awilda Metro M.D.   On: 04/09/2018 21:28        Scheduled Meds: . bictegravir-emtricitabine-tenofovir AF  1 tablet Oral Daily  . carvedilol  25 mg Oral BID WC  . enoxaparin (LOVENOX) injection  40 mg Subcutaneous QHS  . FLUoxetine  40 mg Oral Daily  . irbesartan  150 mg Oral Daily   And  . hydrochlorothiazide  25 mg Oral Daily  . [START ON  04/11/2018] isosorbide mononitrate  30 mg Oral Daily  . LORazepam  1 mg Oral QHS  . regadenoson  0.4 mg Intravenous Once   Continuous Infusions:   LOS: 0 days    Time spent: 30 minutes.     Kathlen Mody, MD [facility] Pager [phone] If 7PM-7AM, please contact night-coverage [email or web address] Password [email or web address] 04/10/2018, 2:17 PM

## 2018-04-11 ENCOUNTER — Encounter (HOSPITAL_COMMUNITY)
Admission: EM | Disposition: A | Discharge status: Home / Self Care | Payer: Self-pay | Source: Home / Self Care | Attending: Emergency Medicine

## 2018-04-11 DIAGNOSIS — B20 Human immunodeficiency virus [HIV] disease: Secondary | ICD-10-CM | POA: Diagnosis not present

## 2018-04-11 DIAGNOSIS — K7460 Unspecified cirrhosis of liver: Secondary | ICD-10-CM | POA: Diagnosis not present

## 2018-04-11 DIAGNOSIS — R079 Chest pain, unspecified: Secondary | ICD-10-CM | POA: Diagnosis not present

## 2018-04-11 DIAGNOSIS — I25750 Atherosclerosis of native coronary artery of transplanted heart with unstable angina: Secondary | ICD-10-CM

## 2018-04-11 DIAGNOSIS — I160 Hypertensive urgency: Secondary | ICD-10-CM | POA: Diagnosis not present

## 2018-04-11 LAB — BASIC METABOLIC PANEL
Anion gap: 5 (ref 5–15)
BUN: 11 mg/dL (ref 8–23)
CO2: 28 mmol/L (ref 22–32)
Calcium: 9.1 mg/dL (ref 8.9–10.3)
Chloride: 105 mmol/L (ref 98–111)
Creatinine, Ser: 1.1 mg/dL (ref 0.61–1.24)
GFR calc Af Amer: >60 mL/min (ref >60)
GFR calc non Af Amer: >60 mL/min (ref >60)
Glucose, Bld: 152 mg/dL — ABNORMAL HIGH (ref 70–99)
Potassium: 3.4 mmol/L — ABNORMAL LOW (ref 3.5–5.1)
Sodium: 138 mmol/L (ref 135–145)

## 2018-04-11 LAB — CBC
HCT: 38.4 % — ABNORMAL LOW (ref 39.0–52.0)
Hemoglobin: 13.3 g/dL (ref 13.0–17.0)
MCH: 34.3 pg — ABNORMAL HIGH (ref 26.0–34.0)
MCHC: 34.6 g/dL (ref 30.0–36.0)
MCV: 99 fL (ref 78.0–100.0)
Platelets: 176 10*3/uL (ref 150–400)
RBC: 3.88 MIL/uL — ABNORMAL LOW (ref 4.22–5.81)
RDW: 12.7 % (ref 11.5–15.5)
WBC: 3.5 10*3/uL — ABNORMAL LOW (ref 4.0–10.5)

## 2018-04-11 SURGERY — LEFT HEART CATH AND CORONARY ANGIOGRAPHY
Anesthesia: LOCAL

## 2018-04-11 MED ORDER — ATORVASTATIN CALCIUM 40 MG PO TABS: 40.0000 mg | ORAL_TABLET | Freq: Every day | ORAL | Status: DC

## 2018-04-11 MED ORDER — NITROGLYCERIN 0.4 MG SL SUBL: 0.4000 mg | SUBLINGUAL_TABLET | SUBLINGUAL | 12 refills | Status: DC | PRN

## 2018-04-11 MED ORDER — POTASSIUM CHLORIDE CRYS ER 20 MEQ PO TBCR
40.0000 meq | EXTENDED_RELEASE_TABLET | Freq: Once | ORAL | Status: AC
  Administered 2018-04-11: 40 meq via ORAL
  Filled 2018-04-11: qty 2

## 2018-04-11 MED ORDER — CARVEDILOL 25 MG PO TABS: 25.0000 mg | ORAL_TABLET | Freq: Two times a day (BID) | ORAL | 0 refills | Status: AC

## 2018-04-11 MED ORDER — ISOSORBIDE MONONITRATE ER 60 MG PO TB24
60.0000 mg | ORAL_TABLET | Freq: Every day | ORAL | Status: DC
  Administered 2018-04-11: 60 mg via ORAL

## 2018-04-11 MED ORDER — ASPIRIN 81 MG PO TBEC: 81.0000 mg | DELAYED_RELEASE_TABLET | Freq: Every day | ORAL | 1 refills | Status: DC

## 2018-04-11 MED ORDER — ATORVASTATIN CALCIUM 40 MG PO TABS: 40.0000 mg | ORAL_TABLET | Freq: Every day | ORAL | 0 refills | Status: AC

## 2018-04-11 MED ORDER — ISOSORBIDE MONONITRATE ER 60 MG PO TB24: 60.0000 mg | ORAL_TABLET | Freq: Every day | ORAL | 0 refills | Status: AC

## 2018-04-11 NOTE — Care Management Obs Status (Signed)
MEDICARE OBSERVATION STATUS NOTIFICATION   Patient Details  Name: Erik BeneJeffery A Settlemire MRN: [id number] Date of Birth: [birth year]   Medicare Observation Status Notification Given:  Yes    Gala LewandowskyGraves-Bigelow, [place] Pettengill Kaye, RN 04/11/2018, 12:20 PM

## 2018-04-11 NOTE — Progress Notes (Signed)
Progress Note  Patient Name: Erik Frederick Date of Encounter: 04/11/2018  Primary Cardiologist: No primary care provider on file.   Subjective   Ambulated last evening after first Imdur dose without angina.  Inpatient Medications    Scheduled Meds: . aspirin EC  81 mg Oral Daily  . bictegravir-emtricitabine-tenofovir AF  1 tablet Oral Daily  . carvedilol  25 mg Oral BID WC  . enoxaparin (LOVENOX) injection  40 mg Subcutaneous QHS  . FLUoxetine  40 mg Oral Daily  . irbesartan  150 mg Oral Daily   And  . hydrochlorothiazide  25 mg Oral Daily  . isosorbide mononitrate  60 mg Oral Daily  . LORazepam  1 mg Oral QHS  . potassium chloride  40 mEq Oral Once  . regadenoson  0.4 mg Intravenous Once   Continuous Infusions:  PRN Meds: acetaminophen, gi cocktail, hydrALAZINE, morphine injection, nitroGLYCERIN, ondansetron (ZOFRAN) IV   Vital Signs    Vitals:   04/10/18 1515 04/10/18 2058 04/11/18 0033 04/11/18 0507  BP: (!) 153/75 133/61 136/70 (!) 152/76  Pulse: (!) 52 (!) 58 (!) 55 (!) 50  Resp:   18   Temp: 99.3 F (37.4 C) 98.4 F (36.9 C) 98.1 F (36.7 C) 98 F (36.7 C)  TempSrc:  Oral Oral Oral  SpO2: 98% 98% 96% 95%  Weight:    113.7 kg  Height:        Intake/Output Summary (Last 24 hours) at 04/11/2018 0902 Last data filed at 04/11/2018 0600 Gross per 24 hour  Intake 600 ml  Output -  Net 600 ml   Filed Weights   04/10/18 0040 04/11/18 0507  Weight: 116.3 kg 113.7 kg    Telemetry    Sinus bradycardia- Personally Reviewed  ECG    Not repeated today - Personally Reviewed  Physical Exam  Mildly obese GEN: No acute distress.   Neck: No JVD Cardiac:  S4 gallop Respiratory: Clear to auscultation bilaterally. GI: Soft, nontender, non-distended  MS: No edema; No deformity. Neuro:  Nonfocal  Psych: Normal affect   Labs    Chemistry Recent Labs  Lab 04/09/18 2007 04/10/18 0224 04/11/18 0355  NA 140 139 138  K 3.3* 3.4* 3.4*  CL 104 105  105  CO2 25 23 28   GLUCOSE 139* 104* 152*  BUN 12 14 11   CREATININE 1.14 1.14 1.10  CALCIUM 9.7 9.1 9.1  GFRNONAA >60 >60 >60  GFRAA >60 >60 >60  ANIONGAP 11 11 5      Hematology Recent Labs  Lab 04/09/18 2007 04/10/18 0224 04/11/18 0355  WBC 3.7* 4.0 3.5*  RBC 4.18* 3.95* 3.88*  HGB 14.1 13.6 13.3  HCT 41.4 40.0 38.4*  MCV 99.0 101.3* 99.0  MCH 33.7 34.4* 34.3*  MCHC 34.1 34.0 34.6  RDW 12.7 12.5 12.7  PLT 204 170 176    Cardiac Enzymes Recent Labs  Lab 04/10/18 0010 04/10/18 0224 04/10/18 1439  TROPONINI 0.06* 0.05* 0.06*    Recent Labs  Lab 04/09/18 2017  TROPIPOC 0.08     BNP Recent Labs  Lab 04/10/18 0010  BNP 26.1     DDimer  Recent Labs  Lab 04/10/18 0010  DDIMER <0.27     Radiology    Dg Chest 2 View  Result Date: 04/09/2018 CLINICAL DATA:  LEFT chest pain beginning yesterday, shortness of breath. History of HIV, smoker and cirrhosis. EXAM: CHEST - 2 VIEW COMPARISON:  Chest radiograph July 15, 2006 FINDINGS: Cardiac silhouette is mildly enlarged. RIGHT  paraspinal rounded density new from prior radiograph, not localized on lateral radiograph. Mediastinal silhouette is not suspicious. Bronchitic changes without pleural effusion or focal consolidation. No pneumothorax. Mild degenerative changes thoracic spine. Surgical clips in the included right abdomen compatible with cholecystectomy. IMPRESSION: 1. Cardiomegaly and pulmonary vascular congestion. 2. Mild bronchitic changes without focal consolidation. 3. Paraspinal rounded density, favoring confluence of shadows though mass not excluded. Recommend close attention on follow-up imaging or, non emergent CT chest with contrast. Electronically Signed   By: Awilda Metroourtnay  Bloomer M.D.   On: 04/09/2018 21:28   Nm Myocar Multi W/spect W/wall Motion / Ef  Result Date: 04/10/2018 CLINICAL DATA:  Acute coronary syndrome.  Elevated troponin. EXAM: MYOCARDIAL IMAGING WITH SPECT (REST AND PHARMACOLOGIC-STRESS)  GATED LEFT VENTRICULAR WALL MOTION STUDY LEFT VENTRICULAR EJECTION FRACTION TECHNIQUE: Standard myocardial SPECT imaging was performed after resting intravenous injection of 10 mCi Tc-1363m tetrofosmin. Subsequently, intravenous infusion of Lexiscan was performed under the supervision of the Cardiology staff. At peak effect of the drug, 30 mCi Tc-5063m tetrofosmin was injected intravenously and standard myocardial SPECT imaging was performed. Quantitative gated imaging was also performed to evaluate left ventricular wall motion, and estimate left ventricular ejection fraction. COMPARISON:  None. FINDINGS: Perfusion: Stress and rest images show a moderate size area of reversibility in the mid and distal portions of the anterolateral wall of the left ventricle. A small to moderate area of reversibility is also seen in the midportion of the septal wall. Wall Motion: Normal left ventricular wall motion. Mild to moderate left ventricular dilation. Left Ventricular Ejection Fraction: 51 % End diastolic volume 194 ml End systolic volume 96 ml IMPRESSION: 1. Reversal myocardial perfusion defects in the anterolateral and septal walls, suspicious for inducible myocardial ischemia. 2. Normal left ventricular wall motion. Mild to moderate left ventricular dilatation noted. 3. Left ventricular ejection fraction 51% 4. Non invasive risk stratification*: High *2012 Appropriate Use Criteria for Coronary Revascularization Focused Update: J Am Coll Cardiol. 2012;59(9):857-881. http://content.dementiazones.comonlinejacc.org/article.aspx?articleid=1201161 Electronically Signed   By: Myles RosenthalJohn  Stahl M.D.   On: 04/10/2018 15:01    Cardiac Studies  2D Doppler echocardiogram 04/10/2018: Study Conclusions  - Left ventricle: The cavity size was normal. There was moderate   concentric hypertrophy. Systolic function was vigorous. The   estimated ejection fraction was in the range of 65% to 70%. Wall   motion was normal; there were no regional wall motion    abnormalities. Features are consistent with a pseudonormal left   ventricular filling pattern, with concomitant abnormal relaxation   and increased filling pressure (grade 2 diastolic dysfunction).   Doppler parameters are consistent with elevated ventricular   end-diastolic filling pressure. - Aortic valve: Trileaflet; mildly thickened, mildly calcified   leaflets. Mean gradient (S): 8 mm Hg. Valve area (VTI): 2.11   cm^2. Valve area (Vmax): 2.16 cm^2. Valve area (Vmean): 2.09   cm^2. - Mitral valve: There was mild regurgitation. - Left atrium: The atrium was moderately dilated. - Right ventricle: The cavity size was normal. Wall thickness was   normal. Systolic function was normal. - Tricuspid valve: There was mild regurgitation. - Pulmonary arteries: Systolic pressure was within the normal   range. - Inferior vena cava: The vessel was normal in size. - Pericardium, extracardiac: There was no pericardial effusion.  Nuclear scintigraphy 04/10/2018: IMPRESSION: 1. Reversal myocardial perfusion defects in the anterolateral and septal walls, suspicious for inducible myocardial ischemia.  2. Normal left ventricular wall motion. Mild to moderate left ventricular dilatation noted.  3. Left ventricular ejection  fraction 51%  4. Non invasive risk stratification*: High  Patient Profile     62 y.o. male with new onset angina and high risk myocardial perfusion study 04/10/2018.  Prefers medical therapy for the time being, declining coronary angiography.  Assessment & Plan    1. Coronary artery disease with new onset angina and high risk myocardial perfusion imaging.  Patient is already on maximum beta-blocker therapy.  Long-acting nitrates have been added.  He has been instructed on nitroglycerin use.  He was counseled to have coronary angiography but is not willing currently.  We discussed the implications of prolonged chest pain and response being immediate report to an emergency room  if not relieved by nitroglycerin.  Also discussed natural history of new onset angina with high risk myocardial perfusion imaging with 10 to 15% risk of acute ischemic event within 6 months.  High intensity statin therapy and aspirin has been started.    [facility] will sign off.   Medication Recommendations: New medication is Imdur 60 mg/day Other recommendations (labs, testing, etc): Needs coronary angiography.  Has been educated.  Not willing to proceed at this time. Follow up as an outpatient: To arrange outpatient follow-up within 1 to 2 weeks.  For questions or updates, please contact [facility] Please consult [email or web address] for contact info under Cardiology/STEMI.      Signed, Lesleigh Noe, MD  04/11/2018, 9:02 AM

## 2018-04-15 NOTE — Discharge Summary (Signed)
Physician Discharge Summary  Erik Frederick ZOX:[id number] DOB: 1956/08/06 DOA: 04/09/2018  PCP: Georgann Housekeeper, MD  Admit date: 04/09/2018 Discharge date: 04/11/18  Admitted From: Home) Disposition:  Home.   Recommendations for Outpatient Follow-up:  1. Follow up with PCP in 1-2 weeks 2. Please obtain BMP/CBC in one week 3. Please follow up with cardiology as recommended.     Discharge Condition:guarded.  CODE STATUS:full code.  Diet recommendation: Heart Healthy    Brief/Interim Summary: Erik A Cooperis a 62 y.o.malewith medical history significant ofHTN, HLD, HIV, cirrhosis, hepatitis C, anxiety, and depression; who presents with complaints of chest pain. Symptoms started 2 days ago after patient had been outside in his yard pulling weeds. Describes a tightness/pressure feeling substernal radiated to the towards his left shoulder down to his elbow. Cardiology consulted and he underwent stress test showing Reversal myocardial perfusion defects in the anterolateral and septal walls, suspicious for inducible myocardial ischemia. High risk study.   Discharge Diagnoses:  Active Problems:   Human immunodeficiency virus (HIV) disease (HCC)   Hepatitis C virus infection without hepatic coma   Hepatic cirrhosis (HCC)   Chest pain   Anxiety and depression   Hypertensive urgency   Leukopenia   Hypokalemia  New onset angina/ with minimally elevated troponins.  High risk study on stress test.  Will need further evaluation with a cardiac catheterization. Cardiology on board.  Echocardiogram ordered.  But patientwas not willing to undergo the procedure even after discussing the dangers of an ischemic event within the next 6 months.  He said he has a trip planned to [facility] and will follow up with cardiology after the trip.   Hypertension:  Sub optimally controlled.  Restarted coreg, ARB, hctz and hydralazine.    Hypokalemia: replaced.    Hyperlipidemia:   Restarted the patient on lipitor    HIV:  Resume Biktarvy.   H/o hep c  Further management as outpatient.      Discharge Instructions  Discharge Instructions    Diet - low sodium heart healthy   Complete by:  As directed    Discharge instructions   Complete by:  As directed    Please follow up with cardiology as recommended.     Allergies as of 04/11/2018   No Known Allergies     Medication List    STOP taking these medications   diphenoxylate-atropine 2.5-0.025 MG tablet Commonly known as:  LOMOTIL   typhoid DR capsule Commonly known as:  VIVOTIF     TAKE these medications   aspirin 81 MG EC tablet Take 1 tablet (81 mg total) by mouth daily.   atorvastatin 40 MG tablet Commonly known as:  LIPITOR Take 1 tablet (40 mg total) by mouth daily at 6 PM.   bictegravir-emtricitabine-tenofovir AF 50-200-25 MG Tabs tablet Commonly known as:  BIKTARVY Take 1 tablet by mouth daily.   carvedilol 25 MG tablet Commonly known as:  COREG Take 1 tablet (25 mg total) by mouth 2 (two) times daily with a meal.   FLUoxetine 40 MG capsule Commonly known as:  PROZAC Take 1 capsule (40 mg total) by mouth daily.   isosorbide mononitrate 60 MG 24 hr tablet Commonly known as:  IMDUR Take 1 tablet (60 mg total) by mouth daily.   LORazepam 1 MG tablet Commonly known as:  ATIVAN Take 1 mg by mouth at bedtime.   nitroGLYCERIN 0.4 MG SL tablet Commonly known as:  NITROSTAT Place 1 tablet (0.4 mg total) under the tongue every 5 (  five) minutes as needed for chest pain.   valsartan-hydrochlorothiazide 160-25 MG tablet Commonly known as:  DIOVAN-HCT Take 1 tablet by mouth daily.      Follow-up Information    Georgann Housekeeper, MD. Schedule an appointment as soon as possible for a visit in 1 week(s).   Specialty:  Internal Medicine Contact information: [address] [place] [address] [phone]        Gardiner Barefoot, MD .   Specialty:   Infectious Diseases Contact information: [address] [place] [address] [phone]        Leone Brand, NP Follow up.   Specialties:  Cardiology, Radiology Why:  Follow-up appointment after return from your vacation:  05/08/2018 at [facility] information: [address] [place] [address] [phone]          No Known Allergies  Consultations: Cardiology Dr Katrinka Blazing.   Procedures/Studies: Dg Chest 2 View  Result Date: 04/09/2018 CLINICAL DATA:  LEFT chest pain beginning yesterday, shortness of breath. History of HIV, smoker and cirrhosis. EXAM: CHEST - 2 VIEW COMPARISON:  Chest radiograph July 15, 2006 FINDINGS: Cardiac silhouette is mildly enlarged. RIGHT paraspinal rounded density new from prior radiograph, not localized on lateral radiograph. Mediastinal silhouette is not suspicious. Bronchitic changes without pleural effusion or focal consolidation. No pneumothorax. Mild degenerative changes thoracic spine. Surgical clips in the included right abdomen compatible with cholecystectomy. IMPRESSION: 1. Cardiomegaly and pulmonary vascular congestion. 2. Mild bronchitic changes without focal consolidation. 3. Paraspinal rounded density, favoring confluence of shadows though mass not excluded. Recommend close attention on follow-up imaging or, non emergent CT chest with contrast. Electronically Signed   By: Awilda Metro M.D.   On: 04/09/2018 21:28   Nm Myocar Multi W/spect W/wall Motion / Ef  Result Date: 04/10/2018 CLINICAL DATA:  Acute coronary syndrome.  Elevated troponin. EXAM: MYOCARDIAL IMAGING WITH SPECT (REST AND PHARMACOLOGIC-STRESS) GATED LEFT VENTRICULAR WALL MOTION STUDY LEFT VENTRICULAR EJECTION FRACTION TECHNIQUE: Standard myocardial SPECT imaging was performed after resting intravenous injection of 10 mCi Tc-42m tetrofosmin. Subsequently, intravenous infusion of Lexiscan was performed under the supervision of the Cardiology staff.  At peak effect of the drug, 30 mCi Tc-73m tetrofosmin was injected intravenously and standard myocardial SPECT imaging was performed. Quantitative gated imaging was also performed to evaluate left ventricular wall motion, and estimate left ventricular ejection fraction. COMPARISON:  None. FINDINGS: Perfusion: Stress and rest images show a moderate size area of reversibility in the mid and distal portions of the anterolateral wall of the left ventricle. A small to moderate area of reversibility is also seen in the midportion of the septal wall. Wall Motion: Normal left ventricular wall motion. Mild to moderate left ventricular dilation. Left Ventricular Ejection Fraction: 51 % End diastolic volume 194 ml End systolic volume 96 ml IMPRESSION: 1. Reversal myocardial perfusion defects in the anterolateral and septal walls, suspicious for inducible myocardial ischemia. 2. Normal left ventricular wall motion. Mild to moderate left ventricular dilatation noted. 3. Left ventricular ejection fraction 51% 4. Non invasive risk stratification*: High *2012 Appropriate Use Criteria for Coronary Revascularization Focused Update: J Am Coll Cardiol. 2012;59(9):857-881. http://content.dementiazones.com.aspx?articleid=1201161 Electronically Signed   By: Myles Rosenthal M.D.   On: 04/10/2018 15:01     Subjective: No chest pain or sob.   Discharge Exam: Vitals:   04/11/18 0507 04/11/18 0904  BP: (!) 152/76 (!) 155/66  Pulse: (!) 50 (!) 59  Resp:    Temp: 98 F (36.7  C)   SpO2: 95%    Vitals:   04/10/18 2058 04/11/18 0033 04/11/18 0507 04/11/18 0904  BP: 133/61 136/70 (!) 152/76 (!) 155/66  Pulse: (!) 58 (!) 55 (!) 50 (!) 59  Resp:  18    Temp: 98.4 F (36.9 C) 98.1 F (36.7 C) 98 F (36.7 C)   TempSrc: Oral Oral Oral   SpO2: 98% 96% 95%   Weight:   113.7 kg   Height:        General: Pt is alert, awake, not in acute distress Cardiovascular: RRR, S1/S2 +, no rubs, no gallops Respiratory: CTA bilaterally,  no wheezing, no rhonchi Abdominal: Soft, NT, ND, bowel sounds + Extremities: no edema, no cyanosis    The results of significant diagnostics from this hospitalization (including imaging, microbiology, ancillary and laboratory) are listed below for reference.     Microbiology: No results found for this or any previous visit (from the past 240 hour(s)).   Labs: BNP (last 3 results) Recent Labs    04/10/18 0010  BNP 26.1   Basic Metabolic Panel: Recent Labs  Lab 04/09/18 2007 04/10/18 0224 04/11/18 0355  NA 140 139 138  K 3.3* 3.4* 3.4*  CL 104 105 105  CO2 25 23 28   GLUCOSE 139* 104* 152*  BUN 12 14 11   CREATININE 1.14 1.14 1.10  CALCIUM 9.7 9.1 9.1  MG  --  2.0  --    Liver Function Tests: No results for input(s): AST, ALT, ALKPHOS, BILITOT, PROT, ALBUMIN in the last 168 hours. No results for input(s): LIPASE, AMYLASE in the last 168 hours. No results for input(s): AMMONIA in the last 168 hours. CBC: Recent Labs  Lab 04/09/18 2007 04/10/18 0224 04/11/18 0355  WBC 3.7* 4.0 3.5*  NEUTROABS  --  1.8  --   HGB 14.1 13.6 13.3  HCT 41.4 40.0 38.4*  MCV 99.0 101.3* 99.0  PLT 204 170 176   Cardiac Enzymes: Recent Labs  Lab 04/10/18 0010 04/10/18 0224 04/10/18 1439  TROPONINI 0.06* 0.05* 0.06*   BNP: Invalid input(s): POCBNP CBG: No results for input(s): GLUCAP in the last 168 hours. D-Dimer No results for input(s): DDIMER in the last 72 hours. Hgb A1c No results for input(s): HGBA1C in the last 72 hours. Lipid Profile No results for input(s): CHOL, HDL, LDLCALC, TRIG, CHOLHDL, LDLDIRECT in the last 72 hours. Thyroid function studies No results for input(s): TSH, T4TOTAL, T3FREE, THYROIDAB in the last 72 hours.  Invalid input(s): FREET3 Anemia work up No results for input(s): VITAMINB12, FOLATE, FERRITIN, TIBC, IRON, RETICCTPCT in the last 72 hours. Urinalysis    Component Value Date/Time   COLORURINE ORANGE (A) 04/06/2011 1432   APPEARANCEUR  CLEAR 04/06/2011 1432   LABSPEC 1.022 04/06/2011 1432   PHURINE 5.5 04/06/2011 1432   GLUCOSEU NEG 04/06/2011 1432   HGBUR NEG 04/06/2011 1432   BILIRUBINUR SMALL (A) 04/06/2011 1432   KETONESUR NEG 04/06/2011 1432   PROTEINUR NEG 04/06/2011 1432   UROBILINOGEN 1 04/06/2011 1432   NITRITE NEG 04/06/2011 1432   LEUKOCYTESUR NEG 04/06/2011 1432   Sepsis Labs Invalid input(s): PROCALCITONIN,  WBC,  LACTICIDVEN Microbiology No results found for this or any previous visit (from the past 240 hour(s)).   Time coordinating discharge: 35 minutes  SIGNED:   Kathlen Mody, MD  [facility] 04/15/2018, 8:17 AM Pager   If 7PM-7AM, please contact night-coverage [email or web address] Password [email or web address]

## 2018-05-07 NOTE — Progress Notes (Deleted)
Cardiology Office Note   Date:  05/07/2018   ID:  Erik Frederick, DOB [birth year], MRN [id number]  PCP:  Georgann Housekeeper, MD  Cardiologist:  Dr. Katrinka Blazing    No chief complaint on file.     History of Present Illness: Erik Frederick is a 62 y.o. male who presents for post hospitalization for chest pain.  + hx of HTN, age and HIV. + LVH on EKG LVH, with strain patter, neg MI.  Cardiac cath recommended but pt reluctant. .   nuc study wjith with defects in the anterolateral and septal wall possible ischemia EF 51% High risk.  Pt refused cath and requested medical therapy.  High intensity statin therapy and ASA started.   today    Past Medical History:  Diagnosis Date  . Anxiety   . Cirrhosis (HCC)   . Depression   . GERD (gastroesophageal reflux disease)   . Hepatitis C   . High cholesterol   . HIV infection (HCC)   . Hypertension   . Insomnia   . Osteoarthritis   . Pneumonia     Past Surgical History:  Procedure Laterality Date  . CHOLECYSTECTOMY       Current Outpatient Medications  Medication Sig Dispense Refill  . aspirin EC 81 MG EC tablet Take 1 tablet (81 mg total) by mouth daily. 30 tablet 1  . atorvastatin (LIPITOR) 40 MG tablet Take 1 tablet (40 mg total) by mouth daily at 6 PM. 30 tablet 0  . bictegravir-emtricitabine-tenofovir AF (BIKTARVY) 50-200-25 MG TABS tablet Take 1 tablet by mouth daily. 30 tablet 5  . carvedilol (COREG) 25 MG tablet Take 1 tablet (25 mg total) by mouth 2 (two) times daily with a meal. 60 tablet 0  . FLUoxetine (PROZAC) 40 MG capsule Take 1 capsule (40 mg total) by mouth daily. 30 capsule 6  . isosorbide mononitrate (IMDUR) 60 MG 24 hr tablet Take 1 tablet (60 mg total) by mouth daily. 30 tablet 0  . LORazepam (ATIVAN) 1 MG tablet Take 1 mg by mouth at bedtime.    . nitroGLYCERIN (NITROSTAT) 0.4 MG SL tablet Place 1 tablet (0.4 mg total) under the tongue every 5 (five) minutes as needed for chest pain. 60 tablet 12  .  valsartan-hydrochlorothiazide (DIOVAN-HCT) 160-25 MG tablet Take 1 tablet by mouth daily.     No current facility-administered medications for this visit.     Allergies:   Patient has no known allergies.    Social History:  The patient  reports that he has been smoking cigarettes and e-cigarettes. He started smoking about 5 years ago. He has been smoking about 0.25 packs per day. He has never used smokeless tobacco. He reports that he drinks about 21.0 standard drinks of alcohol per week. He reports that he does not use drugs.   Family History:  The patient's ***family history includes Alzheimer's disease in his father; Breast cancer in his sister; Diabetes (age of onset: 44) in his mother; Gallstones in his father; Heart disease in his mother.    ROS:  General:no colds or fevers, no weight changes Skin:no rashes or ulcers HEENT:no blurred vision, no congestion CV:see HPI PUL:see HPI GI:no diarrhea constipation or melena, no indigestion GU:no hematuria, no dysuria MS:no joint pain, no claudication Neuro:no syncope, no lightheadedness Endo:no diabetes, no thyroid disease Wt Readings from Last 3 Encounters:  04/11/18 250 lb 11.2 oz (113.7 kg)  02/03/18 257 lb (116.6 kg)  10/08/17 257 lb (116.6 kg)  PHYSICAL EXAM: VS:  There were no vitals taken for this visit. , BMI There is no height or weight on file to calculate BMI. General:Pleasant affect, NAD Skin:Warm and dry, brisk capillary refill HEENT:normocephalic, sclera clear, mucus membranes moist Neck:supple, no JVD, no bruits  Heart:S1S2 RRR without murmur, gallup, rub or click Lungs:clear without rales, rhonchi, or wheezes WUJ:WJXB, non tender, + BS, do not palpate liver spleen or masses Ext:no lower ext edema, 2+ pedal pulses, 2+ radial pulses Neuro:alert and oriented, MAE, follows commands, + facial symmetry    EKG:  EKG is ordered today. The ekg ordered today demonstrates ***   Recent Labs: 10/08/2017: ALT  26 04/10/2018: B Natriuretic Peptide 26.1; Magnesium 2.0 04/11/2018: BUN 11; Creatinine, Ser 1.10; Hemoglobin 13.3; Platelets 176; Potassium 3.4; Sodium 138    Lipid Panel    Component Value Date/Time   CHOL 130 04/10/2018 0224   TRIG 50 04/10/2018 0224   HDL 46 04/10/2018 0224   CHOLHDL 2.8 04/10/2018 0224   VLDL 10 04/10/2018 0224   LDLCALC 74 04/10/2018 0224   LDLCALC 87 10/08/2017 1221       Other studies Reviewed: Additional studies/ records that were reviewed today include: ***.   ASSESSMENT AND PLAN:  1.  ***   Current medicines are reviewed with the patient today.  The patient Has no concerns regarding medicines.  The following changes have been made:  See above Labs/ tests ordered today include:see above  Disposition:   FU:  see above  Signed, Nada Boozer, NP  05/07/2018 5:30 PM    [facility] [address] [employer], [place], [place]  [address]/ [address] [place], [place] Phone: [phone]; Fax: [phone]  [phone]

## 2018-05-08 ENCOUNTER — Ambulatory Visit: Payer: Medicare Other | Admitting: Cardiology

## 2018-05-08 DIAGNOSIS — R0989 Other specified symptoms and signs involving the circulatory and respiratory systems: Secondary | ICD-10-CM

## 2018-05-14 ENCOUNTER — Other Ambulatory Visit: Payer: Medicare Other

## 2018-05-28 ENCOUNTER — Encounter: Payer: Medicare Other | Admitting: Internal Medicine

## 2018-06-06 ENCOUNTER — Other Ambulatory Visit: Payer: Self-pay | Admitting: Internal Medicine

## 2018-06-06 DIAGNOSIS — B20 Human immunodeficiency virus [HIV] disease: Secondary | ICD-10-CM

## 2018-08-20 ENCOUNTER — Ambulatory Visit: Payer: Medicare Other

## 2018-08-20 ENCOUNTER — Other Ambulatory Visit: Payer: Medicare Other

## 2018-08-20 DIAGNOSIS — B20 Human immunodeficiency virus [HIV] disease: Secondary | ICD-10-CM

## 2018-08-21 LAB — T-HELPER CELL (CD4) - (RCID CLINIC ONLY)
CD4 % Helper T Cell: 27 % — ABNORMAL LOW (ref 33–55)
CD4 T Cell Abs: 420 /uL (ref 400–2700)

## 2018-08-22 LAB — COMPLETE METABOLIC PANEL WITH GFR
AG Ratio: 1.3 (calc) (ref 1.0–2.5)
ALT: 24 U/L (ref 9–46)
AST: 22 U/L (ref 10–35)
Albumin: 4.3 g/dL (ref 3.6–5.1)
Alkaline phosphatase (APISO): 88 U/L (ref 40–115)
BUN/Creatinine Ratio: 10 (calc) (ref 6–22)
BUN: 14 mg/dL (ref 7–25)
CO2: 28 mmol/L (ref 20–32)
Calcium: 9.6 mg/dL (ref 8.6–10.3)
Chloride: 103 mmol/L (ref 98–110)
Creat: 1.37 mg/dL — ABNORMAL HIGH (ref 0.70–1.25)
GFR, Est African American: 63 mL/min/{1.73_m2} (ref >=60)
GFR, Est Non African American: 55 mL/min/{1.73_m2} — ABNORMAL LOW (ref >=60)
Globulin: 3.2 g/dL (calc) (ref 1.9–3.7)
Glucose, Bld: 100 mg/dL — ABNORMAL HIGH (ref 65–99)
Potassium: 4.1 mmol/L (ref 3.5–5.3)
Sodium: 139 mmol/L (ref 135–146)
Total Bilirubin: 0.8 mg/dL (ref 0.2–1.2)
Total Protein: 7.5 g/dL (ref 6.1–8.1)

## 2018-08-22 LAB — HIV-1 RNA QUANT-NO REFLEX-BLD
HIV 1 RNA Quant: <20 copies/mL
HIV-1 RNA Quant, Log: <1.3 Log copies/mL

## 2018-09-15 ENCOUNTER — Encounter: Payer: Medicare Other | Admitting: Internal Medicine

## 2018-10-09 ENCOUNTER — Encounter: Payer: Self-pay | Admitting: Internal Medicine

## 2019-02-23 IMAGING — CR DG LUMBAR SPINE 2-3V
3 series · 3 of 3 positions shown · non-contrast
Comparison: None.

CLINICAL DATA: Lumbago for 4 months

EXAM:
LUMBAR SPINE - 2-3 VIEW

[t l-spine a.p.]
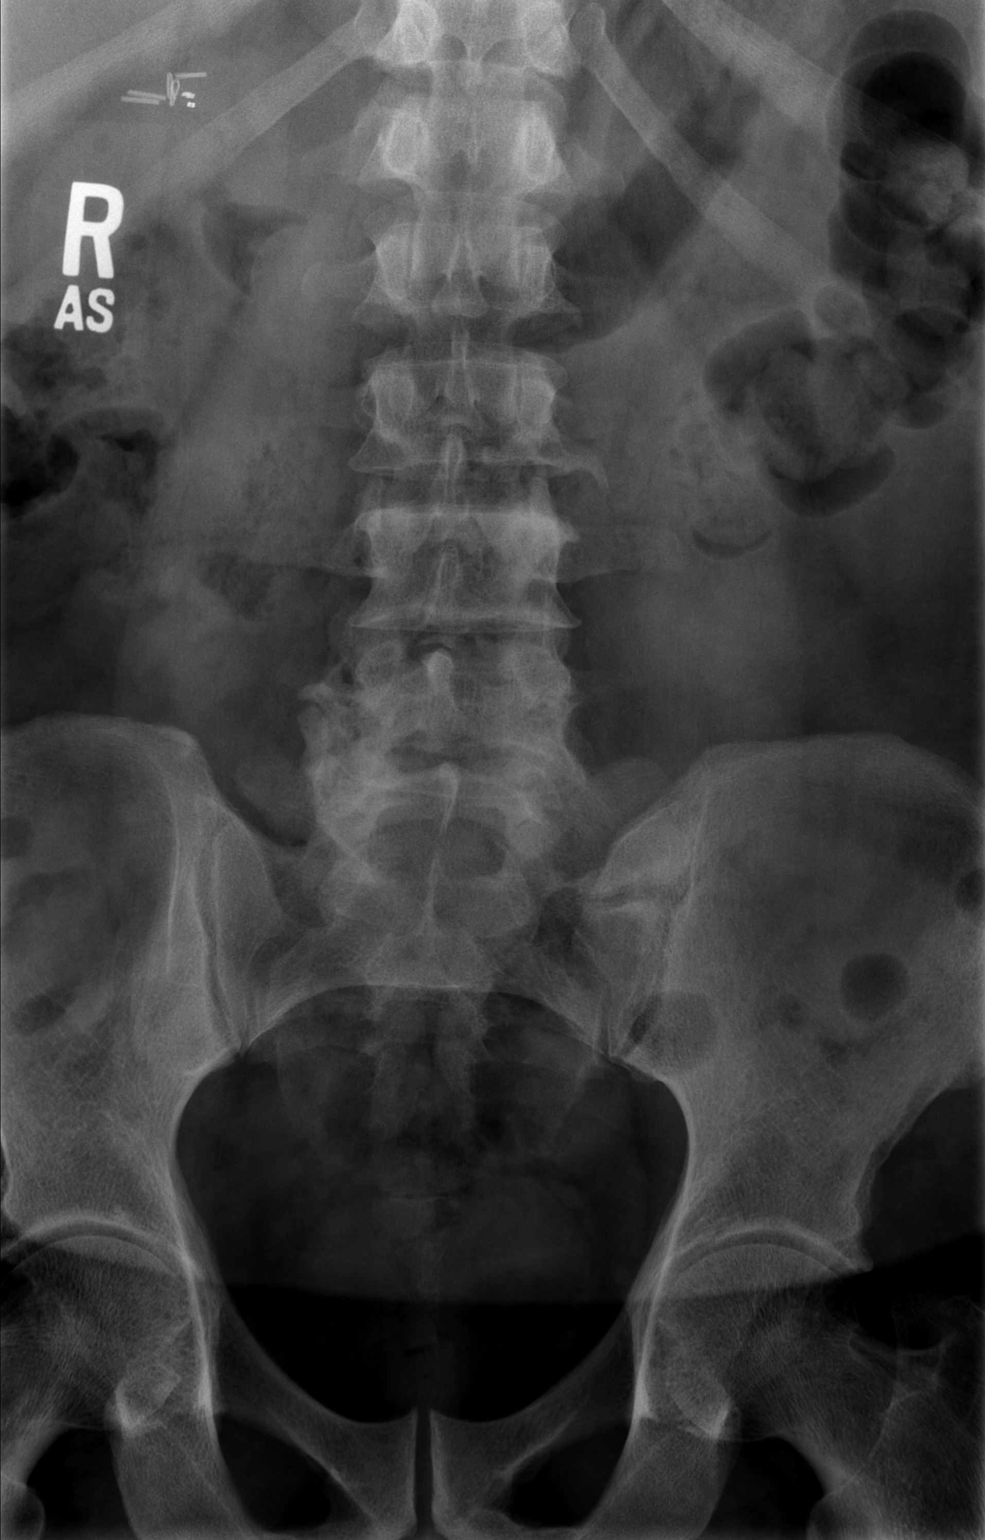

[t l-spine lat]
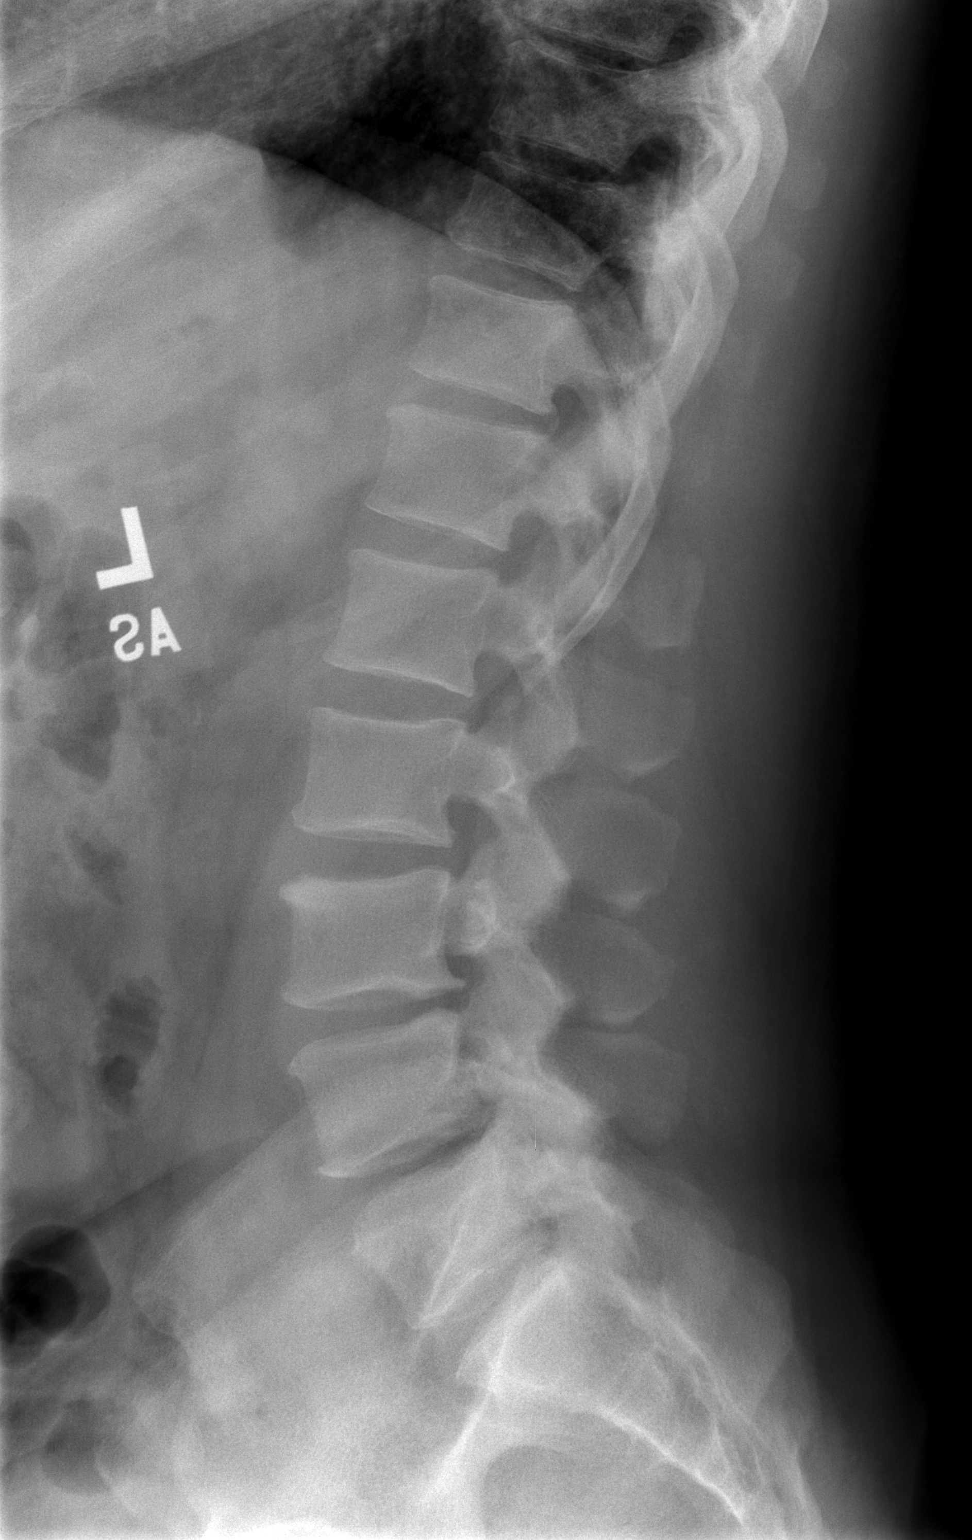

[t l-spine l5-s1 spot]
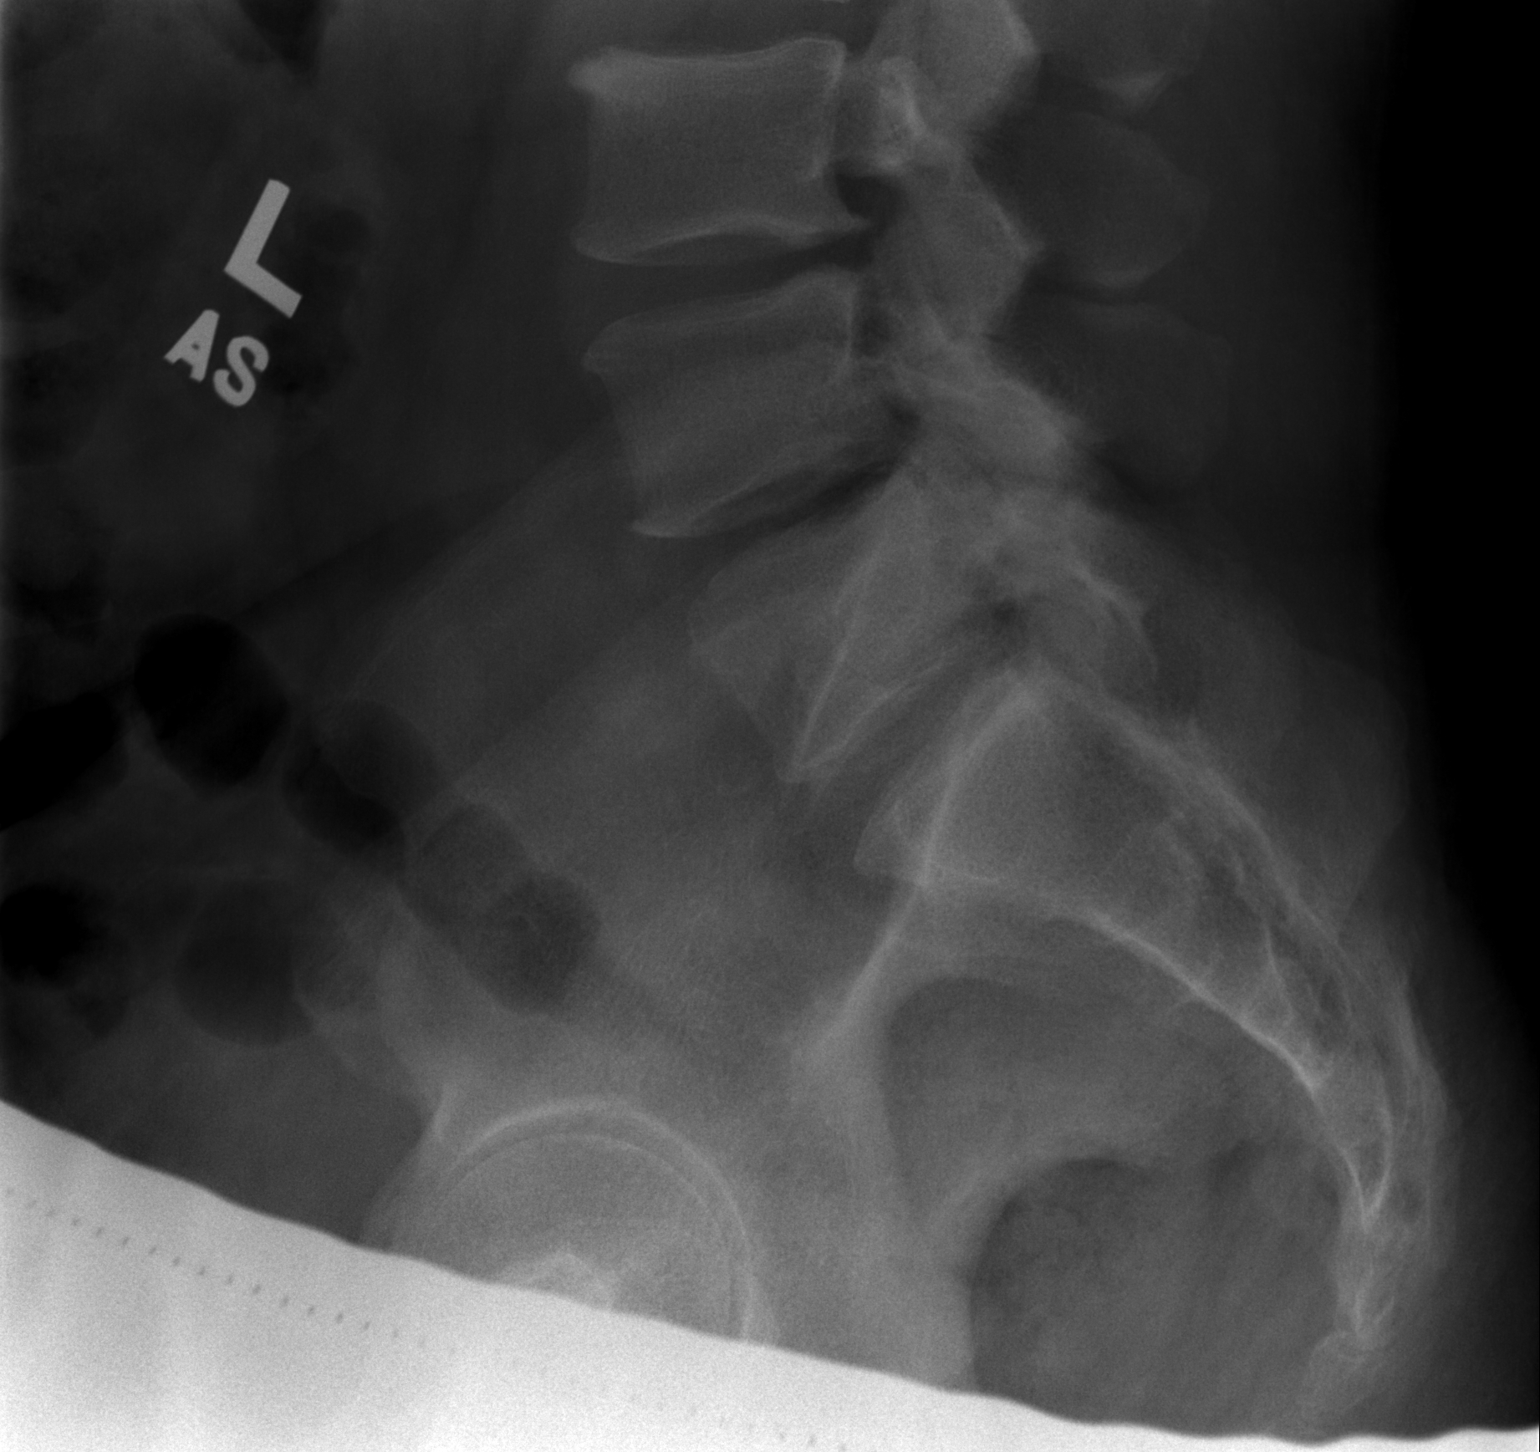

[3 of 3 positions shown; findings below may reference images not displayed]

FINDINGS: Frontal, lateral, and spot lumbosacral lateral images were obtained.
There are 5 non-rib-bearing lumbar type vertebral bodies. There is a
transitional S1 vertebra with an assimilation joint on the left. No
fracture or spondylolisthesis. There is disc space narrowing at L4-5
and L5-S1. No erosive change. There is an osteophyte arising
inferiorly on the left at L3.
IMPRESSION: Osteoarthritic change at L4-5 and to a slightly lesser degree L5-S1.
No fracture or spondylolisthesis.

## 2019-12-23 ENCOUNTER — Ambulatory Visit: Payer: Medicare Other | Admitting: Cardiology

## 2019-12-23 ENCOUNTER — Encounter: Payer: Self-pay | Admitting: Cardiology

## 2019-12-23 ENCOUNTER — Other Ambulatory Visit: Payer: Self-pay

## 2019-12-23 VITALS — BP 158/85 | HR 52 | Temp 97.7°F | Ht 72.0 in | Wt 251.0 lb

## 2019-12-23 DIAGNOSIS — R9439 Abnormal result of other cardiovascular function study: Secondary | ICD-10-CM

## 2019-12-23 DIAGNOSIS — I2510 Atherosclerotic heart disease of native coronary artery without angina pectoris: Secondary | ICD-10-CM

## 2019-12-23 DIAGNOSIS — I10 Essential (primary) hypertension: Secondary | ICD-10-CM

## 2019-12-23 DIAGNOSIS — F17200 Nicotine dependence, unspecified, uncomplicated: Secondary | ICD-10-CM

## 2019-12-23 MED ORDER — CHANTIX STARTING MONTH PAK 0.5 MG X 11 & 1 MG X 42 PO TABS: ORAL_TABLET | ORAL | 1 refills | Status: DC

## 2019-12-23 NOTE — Progress Notes (Signed)
Patient referred by [name], MD for CAD  Subjective:   Erik Frederick, male    DOB: Jan 16, 1956, 64 y.o.   MRN: [id number]   Chief Complaint  Patient presents with  . Coronary Artery Disease  . New Patient (Initial Visit)     HPI   64 y.o. African American male with hypertension, CAD, liver cirrhosis due to hepatitis C, depression  Patient lives by himself, works for [employer] 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 He also has a flight of stairs inside his apartment. With this level of activity, he does not have any angina symptoms. He has mild stable exertional dyspnea that is unchanged. For the past 1 year, he has started smoking 1/2 pack a day. He has tried nicotine patch and vaping without any benefit. He has noticed retrosternal chest pain only while moving.  Patient previously saw Dr. Tamala Julian in 2019 after an episode of chest pain. At that time, stress test was concerning for inferior ischemia coronary angiography was recommended, but he opted against it. Significant worsening of symptoms.  Independently reviewed echocardiogram and stress test from 2019.   Past Medical History:  Diagnosis Date  . Anxiety   . Cirrhosis ([place])   . Depression   . GERD (gastroesophageal reflux disease)   . Hepatitis C   . High cholesterol   . HIV infection ([place])   . Hypertension   . Insomnia   . Osteoarthritis   . Pneumonia      Past Surgical History:  Procedure Laterality Date  . CHOLECYSTECTOMY       Social History   Tobacco Use  Smoking Status Current Every Day Smoker  . Packs/day: 0.25  . Types: Cigarettes, E-cigarettes  . Start date: 01/11/2013  . Last attempt to quit: 02/25/2016  . Years since quitting: 3.8  Smokeless Tobacco Never Used  Tobacco Comment   states he is switching to vape pen currently    Social History   Substance and Sexual Activity  Alcohol Use Yes  . Alcohol/week: 21.0 standard drinks  . Types: 21 Glasses of  wine per week   Comment: beer     Family History  Problem Relation Age of Onset  . Heart disease Mother   . Diabetes Mother 39  . Gallstones Father   . Alzheimer's disease Father        Dementia  . Breast cancer Sister      Current Outpatient Medications on File Prior to Visit  Medication Sig Dispense Refill  . aspirin EC 81 MG EC tablet Take 1 tablet (81 mg total) by mouth daily. 30 tablet 1  . atorvastatin (LIPITOR) 40 MG tablet Take 1 tablet (40 mg total) by mouth daily at 6 PM. 30 tablet 0  . bictegravir-emtricitabine-tenofovir AF (BIKTARVY) 50-200-25 MG TABS tablet Take 1 tablet by mouth daily. 30 tablet 5  . Calcium 600-200 MG-UNIT tablet Take 1 tablet by mouth daily.    . carvedilol (COREG) 25 MG tablet Take 1 tablet (25 mg total) by mouth 2 (two) times daily with a meal. 60 tablet 0  . FLUoxetine (PROZAC) 40 MG capsule Take 1 capsule (40 mg total) by mouth daily. 30 capsule 6  . LORazepam (ATIVAN) 1 MG tablet Take 1 mg by mouth at bedtime.    . Melatonin CR (MELADOX) 3 MG TBCR Take 1 tablet by mouth daily.    . Multiple Vitamins-Minerals (CENTRUM SILVER PO) Take 1 tablet by mouth daily.    Marland Kitchen  nitroGLYCERIN (NITROSTAT) 0.4 MG SL tablet Place 1 tablet (0.4 mg total) under the tongue every 5 (five) minutes as needed for chest pain. 60 tablet 12  . Omega-3 Fatty Acids (FISH OIL) 1200 MG CPDR Take 2,400 mg by mouth daily.    . valsartan-hydrochlorothiazide (DIOVAN-HCT) 160-25 MG tablet Take 1 tablet by mouth daily.    . Zinc 50 MG CAPS Take 1 capsule by mouth daily.    . isosorbide mononitrate (IMDUR) 60 MG 24 hr tablet Take 1 tablet (60 mg total) by mouth daily. (Patient not taking: Reported on 12/23/2019) 30 tablet 0   No current facility-administered medications on file prior to visit.    Cardiovascular and other pertinent studies:  EKG 12/23/2019: Sinus rhythm 51 bpm. Left ventricular hypertrophy. IVCD. Inferolaterall T wave inversion likely due to LVH. No change  compared to previous EKG in 2019.  Nuclear stress test 2019: 1. Reversal myocardial perfusion defects in the anterolateral and septal walls, suspicious for inducible myocardial ischemia. 2. Normal left ventricular wall motion. Mild to moderate left ventricular dilatation noted. 3. Left ventricular ejection fraction 51% 4. Non invasive risk stratification*: High  Echocardiogram 03/2018: Mod LVH. LVEF 65-70% Grade 2 DD Mod LAE Mild AS Mild MR   Recent labs: 02/10/2019: Glucose 105, BUN/Cr 15/1.22. EGFR 60. Na/K 139/3.6. Rest of the CMP normal (normal albumin, AST/ALT) H/H 14/42. MCV 94. Platelets 242 HbA1C 5.5% Chol 161, TG 71, HDL 63, LDL 84   Review of Systems  Cardiovascular: Positive for chest pain (As per HPI). Negative for dyspnea on exertion, leg swelling, palpitations and syncope.         Vitals:   12/23/19 1245 12/23/19 1259  BP: (!) 175/88 (!) 158/85  Pulse: (!) 52 (!) 52  Temp: 97.7 F (36.5 C)   SpO2: 97% 97%     Body mass index is 34.04 kg/m. Filed Weights   12/23/19 1245  Weight: 251 lb (113.9 kg)     Objective:   Physical Exam  Constitutional: No distress.  Neck: No JVD present.  Cardiovascular: Normal rate, regular rhythm, normal heart sounds and intact distal pulses.  No murmur heard. Pulmonary/Chest: Effort normal and breath sounds normal. He has no wheezes. He has no rales.  Musculoskeletal:        General: No edema.  Nursing note and vitals reviewed.       Assessment & Recommendations:   64 y.o. African American male with hypertension, CAD, liver cirrhosis due to hepatitis C, depression  Chest pain: Rare episode only while vaping. He does not have any exertional anginal symptoms. Even though he has had an abnormal stress test in 2019, he does not have lifestyle limiting angina symptoms at this time. Continued aggressive medical management and tobacco cessation is recommended at this time. Continue aspirin and Lipitor.   Hypertension: Uncontrolled. Continue current medical therapy. Recommend remote patient monitoring. Based on data, will make adjustments-either addition of amlodipine 5 mg daily, or substitution of Imdur 60 mg with BiDil 20-37.5 mg 3 times daily.  Tobacco dependence: Tobacco cessation counseling: - Currently smoking 1/2 packs/day   - Patient was informed of the dangers of tobacco abuse including stroke, cancer, and MI, as well as benefits of tobacco cessation. - Patient is willing to quit at this time. - Approximately 5 mins were spent counseling patient cessation techniques. We discussed various methods to help quit smoking, including deciding on a date to quit, joining a support group, pharmacological agents. Patient would like to use Chantix. - I  will reassess his progress at the next follow-up visit   F/u in 4 weeks    Thank you for referring the patient to [place]. Please feel free to contact with any questions.  Nigel Mormon, MD [facility] Pager: [phone] Office: [phone]

## 2020-01-27 ENCOUNTER — Encounter: Payer: Self-pay | Admitting: Cardiology

## 2020-01-27 ENCOUNTER — Other Ambulatory Visit: Payer: Self-pay

## 2020-01-27 ENCOUNTER — Ambulatory Visit: Payer: Medicare Other | Admitting: Cardiology

## 2020-01-27 VITALS — BP 135/78 | HR 59 | Resp 17 | Ht 72.0 in | Wt 260.0 lb

## 2020-01-27 DIAGNOSIS — I2510 Atherosclerotic heart disease of native coronary artery without angina pectoris: Secondary | ICD-10-CM

## 2020-01-27 DIAGNOSIS — F17200 Nicotine dependence, unspecified, uncomplicated: Secondary | ICD-10-CM

## 2020-01-27 DIAGNOSIS — I10 Essential (primary) hypertension: Secondary | ICD-10-CM

## 2020-01-27 NOTE — Progress Notes (Signed)
Patient referred by [name], MD for CAD  Subjective:   Erik Frederick, male    DOB: [birth year], 64 y.o.   MRN: [id number]   Chief Complaint  Patient presents with  . Hypertension     HPI   64 y.o. African American male with hypertension, CAD, liver cirrhosis due to hepatitis C, depression  Since last visit, blood pressure has improved. He has not had any chest pain, that he used to have only with vaping. He does not have any exertional chest pain. He is trying to quit smoking.  Initial consultation HPI 12/23/2019: Patient lives by himself, works for [employer] 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 He also has a flight of stairs inside his apartment. With this level of activity, he does not have any angina symptoms. He has mild stable exertional dyspnea that is unchanged. For the past 1 year, he has started smoking 1/2 pack a day. He has tried nicotine patch and vaping without any benefit. He has noticed retrosternal chest pain only while moving.  Patient previously saw Dr. Tamala Julian in 2019 after an episode of chest pain. At that time, stress test was concerning for inferior ischemia coronary angiography was recommended, but he opted against it. Significant worsening of symptoms.  Independently reviewed echocardiogram and stress test from 2019.    Current Outpatient Medications on File Prior to Visit  Medication Sig Dispense Refill  . aspirin EC 81 MG EC tablet Take 1 tablet (81 mg total) by mouth daily. 30 tablet 1  . atorvastatin (LIPITOR) 40 MG tablet Take 1 tablet (40 mg total) by mouth daily at 6 PM. 30 tablet 0  . bictegravir-emtricitabine-tenofovir AF (BIKTARVY) 50-200-25 MG TABS tablet Take 1 tablet by mouth daily. 30 tablet 5  . Calcium 600-200 MG-UNIT tablet Take 1 tablet by mouth daily.    . carvedilol (COREG) 25 MG tablet Take 1 tablet (25 mg total) by mouth 2 (two) times daily with a meal. 60 tablet 0  . FLUoxetine (PROZAC) 40 MG  capsule Take 1 capsule (40 mg total) by mouth daily. 30 capsule 6  . isosorbide mononitrate (IMDUR) 60 MG 24 hr tablet Take 1 tablet (60 mg total) by mouth daily. (Patient not taking: Reported on 12/23/2019) 30 tablet 0  . LORazepam (ATIVAN) 1 MG tablet Take 1 mg by mouth at bedtime.    . Melatonin CR (MELADOX) 3 MG TBCR Take 1 tablet by mouth daily.    . Multiple Vitamins-Minerals (CENTRUM SILVER PO) Take 1 tablet by mouth daily.    . nitroGLYCERIN (NITROSTAT) 0.4 MG SL tablet Place 1 tablet (0.4 mg total) under the tongue every 5 (five) minutes as needed for chest pain. 60 tablet 12  . Omega-3 Fatty Acids (FISH OIL) 1200 MG CPDR Take 2,400 mg by mouth daily.    . valsartan-hydrochlorothiazide (DIOVAN-HCT) 160-25 MG tablet Take 1 tablet by mouth daily.    . varenicline (CHANTIX STARTING MONTH PAK) 0.5 MG X 11 & 1 MG X 42 tablet Take one 0.5 mg tablet by mouth once daily for 3 days, then increase to one 0.5 mg tablet twice daily for 4 days, then increase to one 1 mg tablet twice daily. 53 tablet 1  . Zinc 50 MG CAPS Take 1 capsule by mouth daily.     No current facility-administered medications on file prior to visit.    Cardiovascular and other pertinent studies:  EKG 12/23/2019: Sinus rhythm 51 bpm. Left ventricular hypertrophy.  IVCD. Inferolaterall T wave inversion likely due to LVH. No change compared to previous EKG in 2019.  Nuclear stress test 2019: 1. Reversal myocardial perfusion defects in the anterolateral and septal walls, suspicious for inducible myocardial ischemia. 2. Normal left ventricular wall motion. Mild to moderate left ventricular dilatation noted. 3. Left ventricular ejection fraction 51% 4. Non invasive risk stratification*: High  Echocardiogram 03/2018: Mod LVH. LVEF 65-70% Grade 2 DD Mod LAE Mild AS Mild MR   Recent labs: 02/10/2019: Glucose 105, BUN/Cr 15/1.22. EGFR 60. Na/K 139/3.6. Rest of the CMP normal (normal albumin, AST/ALT) H/H 14/42. MCV 94.  Platelets 242 HbA1C 5.5% Chol 161, TG 71, HDL 63, LDL 84   Review of Systems  Cardiovascular: Positive for chest pain (As per HPI). Negative for dyspnea on exertion, leg swelling, palpitations and syncope.         Vitals:   01/27/20 1327  BP: 135/78  Pulse: (!) 59  Resp: 17  SpO2: 98%     Body mass index is 35.26 kg/m. Filed Weights   01/27/20 1327  Weight: 260 lb (117.9 kg)     Objective:   Physical Exam Vitals and nursing note reviewed.  Constitutional:      General: He is not in acute distress. Neck:     Vascular: No JVD.  Cardiovascular:     Rate and Rhythm: Normal rate and regular rhythm.     Pulses: Intact distal pulses.     Heart sounds: Normal heart sounds. No murmur heard.   Pulmonary:     Effort: Pulmonary effort is normal.     Breath sounds: Normal breath sounds. No wheezing or rales.         Assessment & Recommendations:   64 y.o. African American male with hypertension, CAD, liver cirrhosis due to hepatitis C, depression  Chest pain: Rare episode only while vaping, now resolved.  CAD: Presumed CAD with prior episodes of chest pain and abnormal stress test in 2019. However, he does not have any angina symptoms at this time.  Continue aspirin and Lipitor.  Hypertension: Better controlled.   Tobacco dependence: Tobacco cessation counseling: - Currently smoking 1/2 packs/day   - Patient was informed of the dangers of tobacco abuse including stroke, cancer, and MI, as well as benefits of tobacco cessation. - Patient is willing to quit at this time. - Approximately 5 mins were spent counseling patient cessation techniques. We discussed various methods to help quit smoking, including deciding on a date to quit, joining a support group, pharmacological agents. Patient would like to use Chantix. - I will reassess his progress at the next follow-up visit   F/u in 3 months  Maridee Slape Esther Hardy, MD [facility] Pager:  [phone] Office: [phone]

## 2020-04-19 ENCOUNTER — Ambulatory Visit: Payer: Medicare Other

## 2020-04-19 ENCOUNTER — Other Ambulatory Visit: Payer: Self-pay

## 2020-04-29 ENCOUNTER — Ambulatory Visit: Payer: Medicare Other | Admitting: Cardiology

## 2020-04-29 NOTE — Progress Notes (Signed)
Rescheduled

## 2020-05-02 ENCOUNTER — Ambulatory Visit: Payer: Medicare Other | Admitting: Cardiology

## 2020-05-02 NOTE — Progress Notes (Deleted)
Patient referred by [name], MD for CAD  Subjective:   Erik Frederick, male    DOB: 01-01-1956, 64 y.o.   MRN: [id number]   No chief complaint on file.    HPI   64 y.o. African American male with hypertension, CAD, liver cirrhosis due to hepatitis C, depression  Since last visit, blood pressure has improved. He has not had any chest pain, that he used to have only with vaping. He does not have any exertional chest pain. He is trying to quit smoking.  Initial consultation HPI 12/23/2019: Patient lives by himself, works for [employer] 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 He also has a flight of stairs inside his apartment. With this level of activity, he does not have any angina symptoms. He has mild stable exertional dyspnea that is unchanged. For the past 1 year, he has started smoking 1/2 pack a day. He has tried nicotine patch and vaping without any benefit. He has noticed retrosternal chest pain only while moving.  Patient previously saw Dr. Tamala Julian in 2019 after an episode of chest pain. At that time, stress test was concerning for inferior ischemia coronary angiography was recommended, but he opted against it. Significant worsening of symptoms.  Independently reviewed echocardiogram and stress test from 2019.    Current Outpatient Medications on File Prior to Visit  Medication Sig Dispense Refill  . aspirin EC 81 MG EC tablet Take 1 tablet (81 mg total) by mouth daily. 30 tablet 1  . atorvastatin (LIPITOR) 40 MG tablet Take 1 tablet (40 mg total) by mouth daily at 6 PM. 30 tablet 0  . bictegravir-emtricitabine-tenofovir AF (BIKTARVY) 50-200-25 MG TABS tablet Take 1 tablet by mouth daily. 30 tablet 5  . Calcium 600-200 MG-UNIT tablet Take 1 tablet by mouth daily.    . carvedilol (COREG) 25 MG tablet Take 1 tablet (25 mg total) by mouth 2 (two) times daily with a meal. 60 tablet 0  . FLUoxetine (PROZAC) 40 MG capsule Take 1 capsule (40 mg total)  by mouth daily. 30 capsule 6  . isosorbide mononitrate (IMDUR) 60 MG 24 hr tablet Take 1 tablet (60 mg total) by mouth daily. 30 tablet 0  . LORazepam (ATIVAN) 1 MG tablet Take 1 mg by mouth at bedtime.    . Melatonin CR (MELADOX) 3 MG TBCR Take 1 tablet by mouth daily.    . Multiple Vitamins-Minerals (CENTRUM SILVER PO) Take 1 tablet by mouth daily.    . Omega-3 Fatty Acids (FISH OIL) 1200 MG CPDR Take 2,400 mg by mouth daily.    . sildenafil (VIAGRA) 100 MG tablet Take 100 mg by mouth daily as needed.    . valsartan-hydrochlorothiazide (DIOVAN-HCT) 160-25 MG tablet Take 1 tablet by mouth daily.    . varenicline (CHANTIX STARTING MONTH PAK) 0.5 MG X 11 & 1 MG X 42 tablet Take one 0.5 mg tablet by mouth once daily for 3 days, then increase to one 0.5 mg tablet twice daily for 4 days, then increase to one 1 mg tablet twice daily. 53 tablet 1  . Zinc 50 MG CAPS Take 1 capsule by mouth daily.     No current facility-administered medications on file prior to visit.    Cardiovascular and other pertinent studies:  EKG 12/23/2019: Sinus rhythm 51 bpm. Left ventricular hypertrophy. IVCD. Inferolaterall T wave inversion likely due to LVH. No change compared to previous EKG in 2019.  Nuclear stress test 2019: 1.  Reversal myocardial perfusion defects in the anterolateral and septal walls, suspicious for inducible myocardial ischemia. 2. Normal left ventricular wall motion. Mild to moderate left ventricular dilatation noted. 3. Left ventricular ejection fraction 51% 4. Non invasive risk stratification*: High  Echocardiogram 03/2018: Mod LVH. LVEF 65-70% Grade 2 DD Mod LAE Mild AS Mild MR   Recent labs: 02/10/2019: Glucose 105, BUN/Cr 15/1.22. EGFR 60. Na/K 139/3.6. Rest of the CMP normal (normal albumin, AST/ALT) H/H 14/42. MCV 94. Platelets 242 HbA1C 5.5% Chol 161, TG 71, HDL 63, LDL 84   Review of Systems  Cardiovascular: Positive for chest pain (As per HPI). Negative for dyspnea on  exertion, leg swelling, palpitations and syncope.         There were no vitals filed for this visit.   There is no height or weight on file to calculate BMI. There were no vitals filed for this visit.   Objective:   Physical Exam Vitals and nursing note reviewed.  Constitutional:      General: He is not in acute distress. Neck:     Vascular: No JVD.  Cardiovascular:     Rate and Rhythm: Normal rate and regular rhythm.     Pulses: Intact distal pulses.     Heart sounds: Normal heart sounds. No murmur heard.   Pulmonary:     Effort: Pulmonary effort is normal.     Breath sounds: Normal breath sounds. No wheezing or rales.         Assessment & Recommendations:   64 y.o. African American male with hypertension, CAD, liver cirrhosis due to hepatitis C, depression  Chest pain: Rare episode only while vaping, now resolved.  CAD: Presumed CAD with prior episodes of chest pain and abnormal stress test in 2019. However, he does not have any angina symptoms at this time.  Continue aspirin and Lipitor.  Hypertension: Better controlled.   Tobacco dependence: Tobacco cessation counseling: - Currently smoking 1/2 packs/day   - Patient was informed of the dangers of tobacco abuse including stroke, cancer, and MI, as well as benefits of tobacco cessation. - Patient is willing to quit at this time. - Approximately 5 mins were spent counseling patient cessation techniques. We discussed various methods to help quit smoking, including deciding on a date to quit, joining a support group, pharmacological agents. Patient would like to use Chantix. - I will reassess his progress at the next follow-up visit   F/u in 3 months  Erik Frederick Esther Hardy, MD [facility] Pager: [phone] Office: [phone]

## 2020-06-01 ENCOUNTER — Other Ambulatory Visit: Payer: Self-pay

## 2020-06-01 DIAGNOSIS — Z113 Encounter for screening for infections with a predominantly sexual mode of transmission: Secondary | ICD-10-CM

## 2020-06-01 DIAGNOSIS — B20 Human immunodeficiency virus [HIV] disease: Secondary | ICD-10-CM

## 2020-06-01 DIAGNOSIS — Z79899 Other long term (current) drug therapy: Secondary | ICD-10-CM

## 2020-06-06 ENCOUNTER — Other Ambulatory Visit: Payer: Medicare Other

## 2020-06-06 ENCOUNTER — Other Ambulatory Visit: Payer: Self-pay

## 2020-06-06 ENCOUNTER — Other Ambulatory Visit (HOSPITAL_COMMUNITY)
Admission: RE | Admit: 2020-06-06 | Discharge: 2020-06-06 | Disposition: A | Discharge status: Home / Self Care | Payer: Medicare Other | Source: Ambulatory Visit | Attending: Internal Medicine | Admitting: Internal Medicine

## 2020-06-06 ENCOUNTER — Ambulatory Visit: Payer: Medicare Other

## 2020-06-06 DIAGNOSIS — Z79899 Other long term (current) drug therapy: Secondary | ICD-10-CM

## 2020-06-06 DIAGNOSIS — B20 Human immunodeficiency virus [HIV] disease: Secondary | ICD-10-CM | POA: Diagnosis present

## 2020-06-06 DIAGNOSIS — Z113 Encounter for screening for infections with a predominantly sexual mode of transmission: Secondary | ICD-10-CM | POA: Insufficient documentation

## 2020-06-07 ENCOUNTER — Encounter: Payer: Self-pay | Admitting: Internal Medicine

## 2020-06-07 LAB — T-HELPER CELL (CD4) - (RCID CLINIC ONLY)
CD4 % Helper T Cell: 31 % — ABNORMAL LOW (ref 33–65)
CD4 T Cell Abs: 519 /uL (ref 400–1790)

## 2020-06-07 LAB — URINE CYTOLOGY ANCILLARY ONLY
Chlamydia: NEGATIVE
Comment: NEGATIVE
Comment: NORMAL
Neisseria Gonorrhea: NEGATIVE

## 2020-06-08 LAB — LIPID PANEL
Cholesterol: 146 mg/dL (ref <200)
HDL: 52 mg/dL (ref >=40)
LDL Cholesterol (Calc): 77 mg/dL (calc)
Non-HDL Cholesterol (Calc): 94 mg/dL (calc) (ref <130)
Total CHOL/HDL Ratio: 2.8 (calc) (ref <5.0)
Triglycerides: 90 mg/dL (ref <150)

## 2020-06-08 LAB — COMPLETE METABOLIC PANEL WITH GFR
AG Ratio: 1.5 (calc) (ref 1.0–2.5)
ALT: 42 U/L (ref 9–46)
AST: 32 U/L (ref 10–35)
Albumin: 4.4 g/dL (ref 3.6–5.1)
Alkaline phosphatase (APISO): 97 U/L (ref 35–144)
BUN/Creatinine Ratio: 9 (calc) (ref 6–22)
BUN: 11 mg/dL (ref 7–25)
CO2: 28 mmol/L (ref 20–32)
Calcium: 9.4 mg/dL (ref 8.6–10.3)
Chloride: 103 mmol/L (ref 98–110)
Creat: 1.26 mg/dL — ABNORMAL HIGH (ref 0.70–1.25)
GFR, Est African American: 69 mL/min/{1.73_m2} (ref >=60)
GFR, Est Non African American: 59 mL/min/{1.73_m2} — ABNORMAL LOW (ref >=60)
Globulin: 3 g/dL (calc) (ref 1.9–3.7)
Glucose, Bld: 89 mg/dL (ref 65–99)
Potassium: 3.7 mmol/L (ref 3.5–5.3)
Sodium: 139 mmol/L (ref 135–146)
Total Bilirubin: 0.9 mg/dL (ref 0.2–1.2)
Total Protein: 7.4 g/dL (ref 6.1–8.1)

## 2020-06-08 LAB — CBC WITH DIFFERENTIAL/PLATELET
Absolute Monocytes: 384 cells/uL (ref 200–950)
Basophils Absolute: 19 cells/uL (ref 0–200)
Basophils Relative: 0.4 %
Eosinophils Absolute: 91 cells/uL (ref 15–500)
Eosinophils Relative: 1.9 %
HCT: 41.9 % (ref 38.5–50.0)
Hemoglobin: 14.8 g/dL (ref 13.2–17.1)
Lymphs Abs: 1675 cells/uL (ref 850–3900)
MCH: 33.9 pg — ABNORMAL HIGH (ref 27.0–33.0)
MCHC: 35.3 g/dL (ref 32.0–36.0)
MCV: 95.9 fL (ref 80.0–100.0)
MPV: 9.3 fL (ref 7.5–12.5)
Monocytes Relative: 8 %
Neutro Abs: 2630 cells/uL (ref 1500–7800)
Neutrophils Relative %: 54.8 %
Platelets: 236 10*3/uL (ref 140–400)
RBC: 4.37 10*6/uL (ref 4.20–5.80)
RDW: 13.3 % (ref 11.0–15.0)
Total Lymphocyte: 34.9 %
WBC: 4.8 10*3/uL (ref 3.8–10.8)

## 2020-06-08 LAB — RPR: RPR Ser Ql: NONREACTIVE

## 2020-06-08 LAB — HIV-1 RNA QUANT-NO REFLEX-BLD
HIV 1 RNA Quant: <20 Copies/mL
HIV-1 RNA Quant, Log: <1.3 Log cps/mL

## 2020-06-20 ENCOUNTER — Encounter: Payer: Medicare Other | Admitting: Internal Medicine

## 2020-07-04 ENCOUNTER — Other Ambulatory Visit: Payer: Self-pay

## 2020-07-04 ENCOUNTER — Ambulatory Visit (INDEPENDENT_AMBULATORY_CARE_PROVIDER_SITE_OTHER): Payer: Medicare Other | Admitting: Internal Medicine

## 2020-07-04 ENCOUNTER — Encounter: Payer: Self-pay | Admitting: Internal Medicine

## 2020-07-04 VITALS — BP 139/79 | HR 58 | Temp 98.1°F | Wt 258.0 lb

## 2020-07-04 DIAGNOSIS — Z113 Encounter for screening for infections with a predominantly sexual mode of transmission: Secondary | ICD-10-CM

## 2020-07-04 DIAGNOSIS — K7460 Unspecified cirrhosis of liver: Secondary | ICD-10-CM | POA: Diagnosis not present

## 2020-07-04 DIAGNOSIS — B20 Human immunodeficiency virus [HIV] disease: Secondary | ICD-10-CM | POA: Diagnosis not present

## 2020-07-04 NOTE — Assessment & Plan Note (Signed)
Screened negative 

## 2020-07-04 NOTE — Progress Notes (Signed)
   Subjective:    Patient ID: [name], male    DOB: 1955-11-01, 64 y.o.   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  He has been laying low due to COVID.  Did have the Moderna vaccine and will get the booster soon. Had his flu shot.  Back to work.  No new concerns.    Review of Systems  Constitutional: Negative for fatigue.  Gastrointestinal: Negative for diarrhea.  Skin: Negative for rash.       Objective:   Physical Exam Eyes:     General: No scleral icterus. Pulmonary:     Effort: Pulmonary effort is normal.  Neurological:     General: No focal deficit present.     Mental Status: He is alert.  Psychiatric:        Mood and Affect: Mood normal.   SH: no tobacco        Assessment & Plan:

## 2020-07-04 NOTE — Assessment & Plan Note (Signed)
No cirrhosis noted on ultrasound but F4 on elastography.  Platelets well into the normal range.  I will consider an ultrasound with elastography next visit to determine if he needs ongoing surveillance.

## 2020-07-04 NOTE — Assessment & Plan Note (Signed)
He continues to do well on Biktarvy despite the mutation.  No changes, rtc in 6 months.

## 2020-08-19 DIAGNOSIS — M25562 Pain in left knee: Secondary | ICD-10-CM | POA: Diagnosis not present

## 2020-09-02 DIAGNOSIS — M25562 Pain in left knee: Secondary | ICD-10-CM | POA: Diagnosis not present

## 2020-09-15 DIAGNOSIS — H532 Diplopia: Secondary | ICD-10-CM | POA: Diagnosis not present

## 2020-09-15 DIAGNOSIS — H52221 Regular astigmatism, right eye: Secondary | ICD-10-CM | POA: Diagnosis not present

## 2020-10-13 DIAGNOSIS — I10 Essential (primary) hypertension: Secondary | ICD-10-CM | POA: Diagnosis not present

## 2020-10-18 DIAGNOSIS — I10 Essential (primary) hypertension: Secondary | ICD-10-CM | POA: Diagnosis not present

## 2020-10-18 DIAGNOSIS — N182 Chronic kidney disease, stage 2 (mild): Secondary | ICD-10-CM | POA: Diagnosis not present

## 2020-10-18 DIAGNOSIS — M6530 Trigger finger, unspecified finger: Secondary | ICD-10-CM | POA: Diagnosis not present

## 2020-10-18 DIAGNOSIS — B351 Tinea unguium: Secondary | ICD-10-CM | POA: Diagnosis not present

## 2020-10-18 DIAGNOSIS — E785 Hyperlipidemia, unspecified: Secondary | ICD-10-CM | POA: Diagnosis not present

## 2020-10-18 DIAGNOSIS — R7303 Prediabetes: Secondary | ICD-10-CM | POA: Diagnosis not present

## 2020-11-12 DIAGNOSIS — I10 Essential (primary) hypertension: Secondary | ICD-10-CM | POA: Diagnosis not present

## 2020-11-28 ENCOUNTER — Other Ambulatory Visit: Payer: Self-pay

## 2020-11-28 ENCOUNTER — Other Ambulatory Visit: Payer: Medicare Other

## 2020-11-28 DIAGNOSIS — B20 Human immunodeficiency virus [HIV] disease: Secondary | ICD-10-CM

## 2020-11-29 DIAGNOSIS — M65331 Trigger finger, right middle finger: Secondary | ICD-10-CM | POA: Diagnosis not present

## 2020-11-29 DIAGNOSIS — M65341 Trigger finger, right ring finger: Secondary | ICD-10-CM | POA: Diagnosis not present

## 2020-11-29 DIAGNOSIS — M79641 Pain in right hand: Secondary | ICD-10-CM | POA: Diagnosis not present

## 2020-11-29 LAB — T-HELPER CELL (CD4) - (RCID CLINIC ONLY)
CD4 % Helper T Cell: 29 % — ABNORMAL LOW (ref 33–65)
CD4 T Cell Abs: 480 /uL (ref 400–1790)

## 2020-11-30 LAB — HIV-1 RNA QUANT-NO REFLEX-BLD
HIV 1 RNA Quant: NOT DETECTED Copies/mL
HIV-1 RNA Quant, Log: NOT DETECTED Log cps/mL

## 2020-12-12 DIAGNOSIS — I10 Essential (primary) hypertension: Secondary | ICD-10-CM | POA: Diagnosis not present

## 2020-12-13 ENCOUNTER — Encounter: Payer: Medicare Other | Admitting: Internal Medicine

## 2020-12-14 ENCOUNTER — Telehealth: Payer: Self-pay

## 2020-12-14 NOTE — Telephone Encounter (Signed)
Spoke to patient to reschedule missed appointment on 12/13/20. Patient rescheduled for 12/15/20  Sandie Ano, RN .

## 2020-12-15 ENCOUNTER — Ambulatory Visit: Payer: Medicare Other | Admitting: Internal Medicine

## 2020-12-28 DIAGNOSIS — Z20822 Contact with and (suspected) exposure to covid-19: Secondary | ICD-10-CM | POA: Diagnosis not present

## 2021-01-16 DIAGNOSIS — Z20822 Contact with and (suspected) exposure to covid-19: Secondary | ICD-10-CM | POA: Diagnosis not present

## 2021-01-23 DIAGNOSIS — Z20822 Contact with and (suspected) exposure to covid-19: Secondary | ICD-10-CM | POA: Diagnosis not present

## 2021-02-07 DIAGNOSIS — Z20822 Contact with and (suspected) exposure to covid-19: Secondary | ICD-10-CM | POA: Diagnosis not present

## 2021-02-16 DIAGNOSIS — Z20822 Contact with and (suspected) exposure to covid-19: Secondary | ICD-10-CM | POA: Diagnosis not present

## 2021-02-28 DIAGNOSIS — Z20822 Contact with and (suspected) exposure to covid-19: Secondary | ICD-10-CM | POA: Diagnosis not present

## 2021-03-14 DIAGNOSIS — Z20822 Contact with and (suspected) exposure to covid-19: Secondary | ICD-10-CM | POA: Diagnosis not present

## 2021-03-22 DIAGNOSIS — Z20822 Contact with and (suspected) exposure to covid-19: Secondary | ICD-10-CM | POA: Diagnosis not present

## 2021-04-24 ENCOUNTER — Ambulatory Visit: Payer: Medicare Other

## 2021-04-24 ENCOUNTER — Other Ambulatory Visit: Payer: Self-pay

## 2021-04-24 ENCOUNTER — Encounter: Payer: Self-pay | Admitting: Internal Medicine

## 2021-04-24 ENCOUNTER — Ambulatory Visit (INDEPENDENT_AMBULATORY_CARE_PROVIDER_SITE_OTHER): Payer: Medicare Other | Admitting: Internal Medicine

## 2021-04-24 VITALS — BP 160/86 | HR 66 | Temp 98.3°F | Wt 260.4 lb

## 2021-04-24 DIAGNOSIS — Z113 Encounter for screening for infections with a predominantly sexual mode of transmission: Secondary | ICD-10-CM | POA: Diagnosis not present

## 2021-04-24 DIAGNOSIS — B351 Tinea unguium: Secondary | ICD-10-CM | POA: Diagnosis not present

## 2021-04-24 DIAGNOSIS — Z79899 Other long term (current) drug therapy: Secondary | ICD-10-CM | POA: Diagnosis not present

## 2021-04-24 DIAGNOSIS — Z720 Tobacco use: Secondary | ICD-10-CM

## 2021-04-24 DIAGNOSIS — B20 Human immunodeficiency virus [HIV] disease: Secondary | ICD-10-CM

## 2021-04-24 MED ORDER — TERBINAFINE HCL 250 MG PO TABS: 250.0000 mg | ORAL_TABLET | Freq: Every day | ORAL | 2 refills | Status: DC

## 2021-04-24 NOTE — Progress Notes (Signed)
   Subjective:    Patient ID: Erik Frederick, male    DOB: 1956/04/08, 65 y.o.   MRN: [id number]  HPI Here for follow up of HIV He has a history of a 184v mutation and remains on biktarvy.  No new concerns.  Last labs with a CD4 of 480 and viral load not detected.  He has no new issues.  No complaints today.    Review of Systems  Constitutional:  Negative for fatigue.  Gastrointestinal:  Negative for diarrhea and nausea.  Skin:  Negative for rash.      Objective:   Physical Exam Eyes:     General: No scleral icterus. Pulmonary:     Effort: Pulmonary effort is normal.  Neurological:     General: No focal deficit present.     Mental Status: He is alert.  Psychiatric:        Mood and Affect: Mood normal.   SH: + tobacco       Assessment & Plan:

## 2021-04-25 ENCOUNTER — Telehealth: Payer: Self-pay

## 2021-04-25 ENCOUNTER — Encounter: Payer: Self-pay | Admitting: Internal Medicine

## 2021-04-25 LAB — T-HELPER CELL (CD4) - (RCID CLINIC ONLY)
CD4 % Helper T Cell: 32 % — ABNORMAL LOW (ref 33–65)
CD4 T Cell Abs: 512 /uL (ref 400–1790)

## 2021-04-25 NOTE — Telephone Encounter (Signed)
Attempted to call patient regarding Urine Cytology after receiving call from call from [facility] stating that they were not able to run patient's urine. Will need to have patient come back for urine sample.  Not able to reach patient at this time. Is scheduled for follow up in four weeks. Can reorder lab at that appointment or can schedule appointment.  Left voicemail requesting patient call back for the following information. Did not disclose personal information in voicemail. Juanita Laster, RMA

## 2021-04-25 NOTE — Assessment & Plan Note (Signed)
He continues to do well and no new concerns.  Labs today and rtc in 6 months for this

## 2021-04-25 NOTE — Assessment & Plan Note (Signed)
New issue.  Will try Lamisil orally for 12 weeks.  LFTs today and rtc in one month for repeat LFTs.

## 2021-04-25 NOTE — Assessment & Plan Note (Signed)
Will screen today 

## 2021-04-26 DIAGNOSIS — Z1152 Encounter for screening for COVID-19: Secondary | ICD-10-CM | POA: Diagnosis not present

## 2021-04-29 LAB — CBC WITH DIFFERENTIAL/PLATELET
Absolute Monocytes: 426 cells/uL (ref 200–950)
Basophils Absolute: 28 cells/uL (ref 0–200)
Basophils Relative: 0.5 %
Eosinophils Absolute: 280 cells/uL (ref 15–500)
Eosinophils Relative: 5 %
HCT: 43.4 % (ref 38.5–50.0)
Hemoglobin: 14.9 g/dL (ref 13.2–17.1)
Lymphs Abs: 1742 cells/uL (ref 850–3900)
MCH: 33 pg (ref 27.0–33.0)
MCHC: 34.3 g/dL (ref 32.0–36.0)
MCV: 96.2 fL (ref 80.0–100.0)
MPV: 9.2 fL (ref 7.5–12.5)
Monocytes Relative: 7.6 %
Neutro Abs: 3125 cells/uL (ref 1500–7800)
Neutrophils Relative %: 55.8 %
Platelets: 214 10*3/uL (ref 140–400)
RBC: 4.51 10*6/uL (ref 4.20–5.80)
RDW: 13.5 % (ref 11.0–15.0)
Total Lymphocyte: 31.1 %
WBC: 5.6 10*3/uL (ref 3.8–10.8)

## 2021-04-29 LAB — LIPID PANEL
Cholesterol: 134 mg/dL (ref <200)
HDL: 54 mg/dL (ref >=40)
LDL Cholesterol (Calc): 62 mg/dL (calc)
Non-HDL Cholesterol (Calc): 80 mg/dL (calc) (ref <130)
Total CHOL/HDL Ratio: 2.5 (calc) (ref <5.0)
Triglycerides: 96 mg/dL (ref <150)

## 2021-04-29 LAB — COMPLETE METABOLIC PANEL WITH GFR
AG Ratio: 1.5 (calc) (ref 1.0–2.5)
ALT: 59 U/L — ABNORMAL HIGH (ref 9–46)
AST: 40 U/L — ABNORMAL HIGH (ref 10–35)
Albumin: 4.5 g/dL (ref 3.6–5.1)
Alkaline phosphatase (APISO): 99 U/L (ref 35–144)
BUN: 14 mg/dL (ref 7–25)
CO2: 29 mmol/L (ref 20–32)
Calcium: 9.8 mg/dL (ref 8.6–10.3)
Chloride: 104 mmol/L (ref 98–110)
Creat: 1.21 mg/dL (ref 0.70–1.35)
Globulin: 3.1 g/dL (calc) (ref 1.9–3.7)
Glucose, Bld: 89 mg/dL (ref 65–99)
Potassium: 4 mmol/L (ref 3.5–5.3)
Sodium: 140 mmol/L (ref 135–146)
Total Bilirubin: 0.7 mg/dL (ref 0.2–1.2)
Total Protein: 7.6 g/dL (ref 6.1–8.1)
eGFR: 66 mL/min/{1.73_m2} (ref >=60)

## 2021-04-29 LAB — RPR: RPR Ser Ql: NONREACTIVE

## 2021-04-29 LAB — HIV-1 RNA QUANT-NO REFLEX-BLD
HIV 1 RNA Quant: NOT DETECTED Copies/mL
HIV-1 RNA Quant, Log: NOT DETECTED Log cps/mL

## 2021-05-11 DIAGNOSIS — Z1152 Encounter for screening for COVID-19: Secondary | ICD-10-CM | POA: Diagnosis not present

## 2021-05-22 ENCOUNTER — Ambulatory Visit: Payer: Medicare Other | Admitting: Internal Medicine

## 2021-05-22 ENCOUNTER — Telehealth: Payer: Self-pay

## 2021-05-22 NOTE — Telephone Encounter (Signed)
Called patient to see if he would be able to make it to today's appointment, no answer. Left HIPAA compliant voicemail requesting callback.   Liviana Mills D Marvis Bakken, RN  

## 2021-05-26 DIAGNOSIS — Z1152 Encounter for screening for COVID-19: Secondary | ICD-10-CM | POA: Diagnosis not present

## 2021-08-17 ENCOUNTER — Encounter: Payer: Self-pay | Admitting: Internal Medicine

## 2021-08-17 ENCOUNTER — Other Ambulatory Visit (HOSPITAL_COMMUNITY)
Admission: RE | Admit: 2021-08-17 | Discharge: 2021-08-17 | Disposition: A | Discharge status: Home / Self Care | Payer: Medicare Other | Source: Ambulatory Visit | Attending: Internal Medicine | Admitting: Internal Medicine

## 2021-08-17 ENCOUNTER — Ambulatory Visit: Payer: Medicare Other | Admitting: Internal Medicine

## 2021-08-17 ENCOUNTER — Other Ambulatory Visit: Payer: Self-pay

## 2021-08-17 VITALS — BP 173/83 | HR 61 | Temp 98.1°F | Ht 72.0 in | Wt 266.0 lb

## 2021-08-17 DIAGNOSIS — Z113 Encounter for screening for infections with a predominantly sexual mode of transmission: Secondary | ICD-10-CM

## 2021-08-17 DIAGNOSIS — B20 Human immunodeficiency virus [HIV] disease: Secondary | ICD-10-CM | POA: Insufficient documentation

## 2021-08-17 DIAGNOSIS — B351 Tinea unguium: Secondary | ICD-10-CM

## 2021-08-17 NOTE — Assessment & Plan Note (Addendum)
Will screen today Penile lesion does not look c/w wart, clinically without pain and not c/w HSV. Will screen and he will let me know if there are any changes or new concerns.

## 2021-08-17 NOTE — Assessment & Plan Note (Signed)
He has not been back to GI so I will restart his HCC screening with  Ultrasound next visit.

## 2021-08-17 NOTE — Progress Notes (Signed)
° °  Subjective:    Patient ID: Erik Frederick, male    DOB: Nov 28, 1955, 66 y.o.   MRN: [id number]  HPI Here for follow up of HIV He continues on Biktarvy with no missed doses.  He has had no issues with getting, taking or tolerating the medication.  He has had recent sexual activity and concerned with a lesion on his penis.  No discharge.  He has been taking terbinafine for his onychomycosis but little improvement.  He has not yet completed 2 of the 3 months though.     Review of Systems  Constitutional:  Negative for fatigue.  Gastrointestinal:  Negative for diarrhea and nausea.  Genitourinary:  Positive for genital sores.      Objective:   Physical Exam Eyes:     General: No scleral icterus. Pulmonary:     Effort: Pulmonary effort is normal.  Genitourinary:    Comments: At the end there are two areas that are smooth, non-tender, no erythema.  Skin:    Findings: No rash.  Neurological:     Mental Status: He is alert.  Psychiatric:        Mood and Affect: Mood normal.   SH: + tobacco       Assessment & Plan:

## 2021-08-17 NOTE — Assessment & Plan Note (Signed)
He is going to continue for 3 months  Will check his LFTs today

## 2021-08-18 LAB — T-HELPER CELL (CD4) - (RCID CLINIC ONLY)
CD4 % Helper T Cell: 32 % — ABNORMAL LOW (ref 33–65)
CD4 T Cell Abs: 378 /uL — ABNORMAL LOW (ref 400–1790)

## 2021-08-18 LAB — URINE CYTOLOGY ANCILLARY ONLY
Chlamydia: NEGATIVE
Comment: NEGATIVE
Comment: NORMAL
Neisseria Gonorrhea: NEGATIVE

## 2021-08-20 LAB — CBC WITH DIFFERENTIAL/PLATELET
Absolute Monocytes: 340 cells/uL (ref 200–950)
Basophils Absolute: 21 cells/uL (ref 0–200)
Basophils Relative: 0.5 %
Eosinophils Absolute: 80 cells/uL (ref 15–500)
Eosinophils Relative: 1.9 %
HCT: 42.2 % (ref 38.5–50.0)
Hemoglobin: 14.3 g/dL (ref 13.2–17.1)
Lymphs Abs: 1184 cells/uL (ref 850–3900)
MCH: 32.8 pg (ref 27.0–33.0)
MCHC: 33.9 g/dL (ref 32.0–36.0)
MCV: 96.8 fL (ref 80.0–100.0)
MPV: 9.3 fL (ref 7.5–12.5)
Monocytes Relative: 8.1 %
Neutro Abs: 2575 cells/uL (ref 1500–7800)
Neutrophils Relative %: 61.3 %
Platelets: 215 10*3/uL (ref 140–400)
RBC: 4.36 10*6/uL (ref 4.20–5.80)
RDW: 13.6 % (ref 11.0–15.0)
Total Lymphocyte: 28.2 %
WBC: 4.2 10*3/uL (ref 3.8–10.8)

## 2021-08-20 LAB — COMPLETE METABOLIC PANEL WITH GFR
AG Ratio: 1.4 (calc) (ref 1.0–2.5)
ALT: 29 U/L (ref 9–46)
AST: 23 U/L (ref 10–35)
Albumin: 4.3 g/dL (ref 3.6–5.1)
Alkaline phosphatase (APISO): 85 U/L (ref 35–144)
BUN: 12 mg/dL (ref 7–25)
CO2: 28 mmol/L (ref 20–32)
Calcium: 9.2 mg/dL (ref 8.6–10.3)
Chloride: 104 mmol/L (ref 98–110)
Creat: 1.17 mg/dL (ref 0.70–1.35)
Globulin: 3 g/dL (calc) (ref 1.9–3.7)
Glucose, Bld: 113 mg/dL — ABNORMAL HIGH (ref 65–99)
Potassium: 3.7 mmol/L (ref 3.5–5.3)
Sodium: 139 mmol/L (ref 135–146)
Total Bilirubin: 0.9 mg/dL (ref 0.2–1.2)
Total Protein: 7.3 g/dL (ref 6.1–8.1)
eGFR: 69 mL/min/{1.73_m2} (ref >=60)

## 2021-08-20 LAB — RPR: RPR Ser Ql: NONREACTIVE

## 2021-08-20 LAB — HIV-1 RNA QUANT-NO REFLEX-BLD
HIV 1 RNA Quant: NOT DETECTED Copies/mL
HIV-1 RNA Quant, Log: NOT DETECTED Log cps/mL

## 2021-09-21 ENCOUNTER — Ambulatory Visit: Payer: Medicare Other | Admitting: Infectious Diseases

## 2021-09-27 DIAGNOSIS — I10 Essential (primary) hypertension: Secondary | ICD-10-CM | POA: Diagnosis not present

## 2021-10-03 ENCOUNTER — Ambulatory Visit: Payer: Self-pay | Admitting: Family

## 2021-10-24 DIAGNOSIS — M542 Cervicalgia: Secondary | ICD-10-CM | POA: Diagnosis not present

## 2021-10-24 DIAGNOSIS — H259 Unspecified age-related cataract: Secondary | ICD-10-CM | POA: Diagnosis not present

## 2021-10-24 DIAGNOSIS — I10 Essential (primary) hypertension: Secondary | ICD-10-CM | POA: Diagnosis not present

## 2021-10-24 DIAGNOSIS — H269 Unspecified cataract: Secondary | ICD-10-CM | POA: Diagnosis not present

## 2021-10-24 DIAGNOSIS — M25569 Pain in unspecified knee: Secondary | ICD-10-CM | POA: Diagnosis not present

## 2021-10-24 DIAGNOSIS — Z0001 Encounter for general adult medical examination with abnormal findings: Secondary | ICD-10-CM | POA: Diagnosis not present

## 2021-10-24 DIAGNOSIS — Z0000 Encounter for general adult medical examination without abnormal findings: Secondary | ICD-10-CM | POA: Diagnosis not present

## 2021-10-24 DIAGNOSIS — E785 Hyperlipidemia, unspecified: Secondary | ICD-10-CM | POA: Diagnosis not present

## 2021-11-27 DIAGNOSIS — I10 Essential (primary) hypertension: Secondary | ICD-10-CM | POA: Diagnosis not present

## 2021-11-30 DIAGNOSIS — N182 Chronic kidney disease, stage 2 (mild): Secondary | ICD-10-CM | POA: Diagnosis not present

## 2021-11-30 DIAGNOSIS — R7303 Prediabetes: Secondary | ICD-10-CM | POA: Diagnosis not present

## 2021-11-30 DIAGNOSIS — I2510 Atherosclerotic heart disease of native coronary artery without angina pectoris: Secondary | ICD-10-CM | POA: Diagnosis not present

## 2021-11-30 DIAGNOSIS — I10 Essential (primary) hypertension: Secondary | ICD-10-CM | POA: Diagnosis not present

## 2021-11-30 DIAGNOSIS — E785 Hyperlipidemia, unspecified: Secondary | ICD-10-CM | POA: Diagnosis not present

## 2021-11-30 DIAGNOSIS — Z0000 Encounter for general adult medical examination without abnormal findings: Secondary | ICD-10-CM | POA: Diagnosis not present

## 2021-11-30 DIAGNOSIS — Z1389 Encounter for screening for other disorder: Secondary | ICD-10-CM | POA: Diagnosis not present

## 2022-01-23 DIAGNOSIS — K922 Gastrointestinal hemorrhage, unspecified: Secondary | ICD-10-CM | POA: Diagnosis not present

## 2022-01-23 DIAGNOSIS — K921 Melena: Secondary | ICD-10-CM | POA: Diagnosis not present

## 2022-02-05 ENCOUNTER — Other Ambulatory Visit: Payer: Self-pay | Admitting: *Deleted

## 2022-02-05 ENCOUNTER — Encounter: Payer: Self-pay | Admitting: Physician Assistant

## 2022-02-05 ENCOUNTER — Ambulatory Visit: Payer: Medicare Other | Admitting: Physician Assistant

## 2022-02-05 ENCOUNTER — Other Ambulatory Visit (INDEPENDENT_AMBULATORY_CARE_PROVIDER_SITE_OTHER): Payer: Medicare Other

## 2022-02-05 VITALS — BP 152/80 | HR 93 | Ht 73.0 in | Wt 258.0 lb

## 2022-02-05 DIAGNOSIS — K219 Gastro-esophageal reflux disease without esophagitis: Secondary | ICD-10-CM

## 2022-02-05 DIAGNOSIS — B20 Human immunodeficiency virus [HIV] disease: Secondary | ICD-10-CM | POA: Diagnosis not present

## 2022-02-05 DIAGNOSIS — K7030 Alcoholic cirrhosis of liver without ascites: Secondary | ICD-10-CM

## 2022-02-05 DIAGNOSIS — B182 Chronic viral hepatitis C: Secondary | ICD-10-CM

## 2022-02-05 DIAGNOSIS — I2510 Atherosclerotic heart disease of native coronary artery without angina pectoris: Secondary | ICD-10-CM | POA: Diagnosis not present

## 2022-02-05 DIAGNOSIS — K921 Melena: Secondary | ICD-10-CM | POA: Diagnosis not present

## 2022-02-05 LAB — PROTIME-INR
INR: 1.2 ratio — ABNORMAL HIGH (ref 0.8–1.0)
Prothrombin Time: 12.7 s (ref 9.6–13.1)

## 2022-02-05 LAB — CBC WITH DIFFERENTIAL/PLATELET
Basophils Absolute: 0 10*3/uL (ref 0.0–0.1)
Basophils Relative: 0.7 % (ref 0.0–3.0)
Eosinophils Absolute: 0.1 10*3/uL (ref 0.0–0.7)
Eosinophils Relative: 3 % (ref 0.0–5.0)
HCT: 39.2 % (ref 39.0–52.0)
Hemoglobin: 13.9 g/dL (ref 13.0–17.0)
Lymphocytes Relative: 28.4 % (ref 12.0–46.0)
Lymphs Abs: 1.2 10*3/uL (ref 0.7–4.0)
MCHC: 35.5 g/dL (ref 30.0–36.0)
MCV: 95.6 fl (ref 78.0–100.0)
Monocytes Absolute: 0.4 10*3/uL (ref 0.1–1.0)
Monocytes Relative: 10.2 % (ref 3.0–12.0)
Neutro Abs: 2.5 10*3/uL (ref 1.4–7.7)
Neutrophils Relative %: 57.7 % (ref 43.0–77.0)
Platelets: 225 10*3/uL (ref 150.0–400.0)
RBC: 4.1 Mil/uL — ABNORMAL LOW (ref 4.22–5.81)
RDW: 13.8 % (ref 11.5–15.5)
WBC: 4.3 10*3/uL (ref 4.0–10.5)

## 2022-02-05 LAB — COMPREHENSIVE METABOLIC PANEL
ALT: 35 U/L (ref 0–53)
AST: 22 U/L (ref 0–37)
Albumin: 4.5 g/dL (ref 3.5–5.2)
Alkaline Phosphatase: 91 U/L (ref 39–117)
BUN: 15 mg/dL (ref 6–23)
CO2: 26 mEq/L (ref 19–32)
Calcium: 9.6 mg/dL (ref 8.4–10.5)
Chloride: 101 mEq/L (ref 96–112)
Creatinine, Ser: 1.22 mg/dL (ref 0.40–1.50)
GFR: 61.39 mL/min (ref >60.00)
Glucose, Bld: 105 mg/dL — ABNORMAL HIGH (ref 70–99)
Potassium: 3.2 mEq/L — ABNORMAL LOW (ref 3.5–5.1)
Sodium: 136 mEq/L (ref 135–145)
Total Bilirubin: 0.9 mg/dL (ref 0.2–1.2)
Total Protein: 7.8 g/dL (ref 6.0–8.3)

## 2022-02-05 MED ORDER — POTASSIUM CHLORIDE CRYS ER 20 MEQ PO TBCR: 40.0000 meq | EXTENDED_RELEASE_TABLET | Freq: Once | ORAL | 0 refills | Status: DC

## 2022-02-06 LAB — AFP TUMOR MARKER: AFP-Tumor Marker: 5.1 ng/mL (ref <6.1)

## 2022-03-14 ENCOUNTER — Telehealth: Payer: Self-pay

## 2022-03-14 ENCOUNTER — Ambulatory Visit: Payer: Medicare Other | Admitting: Internal Medicine

## 2022-03-14 NOTE — Telephone Encounter (Signed)
Called patient to reschedule missed appointment with Dr. Comer. No answer. Left HIPAA-compliant voicemail requesting call back. Will also send MyChart message.  Zechariah Bissonnette E Anitra Doxtater, RN  

## 2022-05-30 DIAGNOSIS — K219 Gastro-esophageal reflux disease without esophagitis: Secondary | ICD-10-CM | POA: Diagnosis not present

## 2022-05-30 DIAGNOSIS — I2510 Atherosclerotic heart disease of native coronary artery without angina pectoris: Secondary | ICD-10-CM | POA: Diagnosis not present

## 2022-05-30 DIAGNOSIS — R7303 Prediabetes: Secondary | ICD-10-CM | POA: Diagnosis not present

## 2022-05-30 DIAGNOSIS — N182 Chronic kidney disease, stage 2 (mild): Secondary | ICD-10-CM | POA: Diagnosis not present

## 2022-05-30 DIAGNOSIS — E785 Hyperlipidemia, unspecified: Secondary | ICD-10-CM | POA: Diagnosis not present

## 2022-05-30 DIAGNOSIS — Z23 Encounter for immunization: Secondary | ICD-10-CM | POA: Diagnosis not present

## 2022-05-30 DIAGNOSIS — I10 Essential (primary) hypertension: Secondary | ICD-10-CM | POA: Diagnosis not present

## 2022-07-14 ENCOUNTER — Emergency Department (HOSPITAL_COMMUNITY): Payer: Medicare Other

## 2022-07-14 ENCOUNTER — Emergency Department (HOSPITAL_COMMUNITY)
Admission: EM | Admit: 2022-07-14 | Discharge: 2022-07-14 | Disposition: A | Discharge status: Hospice | Payer: Medicare Other | Attending: Emergency Medicine | Admitting: Emergency Medicine

## 2022-07-14 ENCOUNTER — Other Ambulatory Visit: Payer: Self-pay

## 2022-07-14 ENCOUNTER — Encounter (HOSPITAL_COMMUNITY): Payer: Self-pay

## 2022-07-14 DIAGNOSIS — Z21 Asymptomatic human immunodeficiency virus [HIV] infection status: Secondary | ICD-10-CM | POA: Insufficient documentation

## 2022-07-14 DIAGNOSIS — Z79899 Other long term (current) drug therapy: Secondary | ICD-10-CM | POA: Insufficient documentation

## 2022-07-14 DIAGNOSIS — I10 Essential (primary) hypertension: Secondary | ICD-10-CM | POA: Insufficient documentation

## 2022-07-14 DIAGNOSIS — U071 COVID-19: Secondary | ICD-10-CM | POA: Diagnosis not present

## 2022-07-14 DIAGNOSIS — R059 Cough, unspecified: Secondary | ICD-10-CM | POA: Diagnosis not present

## 2022-07-14 LAB — COMPREHENSIVE METABOLIC PANEL
ALT: 41 U/L (ref 0–44)
AST: 34 U/L (ref 15–41)
Albumin: 4 g/dL (ref 3.5–5.0)
Alkaline Phosphatase: 109 U/L (ref 38–126)
Anion gap: 10 (ref 5–15)
BUN: 12 mg/dL (ref 8–23)
CO2: 24 mmol/L (ref 22–32)
Calcium: 9.2 mg/dL (ref 8.9–10.3)
Chloride: 104 mmol/L (ref 98–111)
Creatinine, Ser: 1.34 mg/dL — ABNORMAL HIGH (ref 0.61–1.24)
GFR, Estimated: 58 mL/min — ABNORMAL LOW (ref >60)
Glucose, Bld: 110 mg/dL — ABNORMAL HIGH (ref 70–99)
Potassium: 3.4 mmol/L — ABNORMAL LOW (ref 3.5–5.1)
Sodium: 138 mmol/L (ref 135–145)
Total Bilirubin: 0.5 mg/dL (ref 0.3–1.2)
Total Protein: 7.8 g/dL (ref 6.5–8.1)

## 2022-07-14 LAB — CBC
HCT: 40.3 % (ref 39.0–52.0)
Hemoglobin: 14 g/dL (ref 13.0–17.0)
MCH: 33.7 pg (ref 26.0–34.0)
MCHC: 34.7 g/dL (ref 30.0–36.0)
MCV: 96.9 fL (ref 80.0–100.0)
Platelets: 207 10*3/uL (ref 150–400)
RBC: 4.16 MIL/uL — ABNORMAL LOW (ref 4.22–5.81)
RDW: 12.9 % (ref 11.5–15.5)
WBC: 5.3 10*3/uL (ref 4.0–10.5)
nRBC: 0 % (ref 0.0–0.2)

## 2022-07-14 LAB — RESP PANEL BY RT-PCR (FLU A&B, COVID) ARPGX2
Influenza A by PCR: NEGATIVE
Influenza B by PCR: NEGATIVE
SARS Coronavirus 2 by RT PCR: POSITIVE — AB

## 2022-07-14 MED ORDER — BENZONATATE 100 MG PO CAPS: 100.0000 mg | ORAL_CAPSULE | Freq: Three times a day (TID) | ORAL | 0 refills | Status: DC

## 2022-07-14 NOTE — ED Provider Triage Note (Signed)
Emergency Medicine Provider Triage Evaluation Note  EARL LOSEE , a 66 y.o. male  was evaluated in triage.  Pt complains of headache, sore throat, congestion, productive cough x 4 days. Some loose stools. Thinks he has had a fever but hadn't checked his temp. Taking chloraseptic and mucinex without relief. Follows with ID for HIV, reports normal CD4 counts.   Review of Systems  Positive: As above Negative: CP, SOB, blood in stool  Physical Exam  BP (!) 149/72 (BP Location: Left Arm)   Pulse 73   Temp 100 F (37.8 C) (Oral)   Resp 20   Ht 6' (1.829 m)   Wt 119.3 kg   SpO2 99%   BMI 35.67 kg/m  Gen:   Awake, no distress   Resp:  Normal effort  MSK:   Moves extremities without difficulty  Other:    Medical Decision Making  Medically screening exam initiated at 2:37 PM.  Appropriate orders placed.  [name] was informed that the remainder of the evaluation will be completed by another provider, this initial triage assessment does not replace that evaluation, and the importance of remaining in the ED until their evaluation is complete.     Colette Dicamillo T, PA-C 07/14/22 1438

## 2022-07-14 NOTE — ED Provider Notes (Signed)
[facility] EMERGENCY DEPARTMENT Provider Note   CSN: [id number] Arrival date & time: 07/14/22  1246     History  Chief Complaint  Patient presents with   Flu Symptoms    Erik Frederick is a 66 y.o. male.  Patient is a 25-year-old male with a history of HIV, hypertension, cirrhosis, hepatitis C and high cholesterol who is presenting today with a 6-day history of cough, congestion, low-grade fever, loose stool, sore throat.  He reports the cough is just making it so he is unable to sleep at night.  He denies any shortness of breath or vomiting.  He is having some general malaise and myalgias.  He has received all 3 of his COVID vaccines.  He was scheduled to have his RSV vaccine next week.  He did take a home COVID test on Monday which was negative.  The history is provided by the patient.       Home Medications Prior to Admission medications   Medication Sig Start Date End Date Taking? Authorizing Provider  benzonatate (TESSALON) 100 MG capsule Take 1 capsule (100 mg total) by mouth every 8 (eight) hours. 07/14/22  Yes Gwyneth Sprout, MD  atorvastatin (LIPITOR) 40 MG tablet Take 1 tablet (40 mg total) by mouth daily at 6 PM. 04/11/18   Kathlen Mody, MD  bictegravir-emtricitabine-tenofovir AF (BIKTARVY) 50-200-25 MG TABS tablet Take 1 tablet by mouth daily. 12/18/17   Gardiner Barefoot, MD  bictegravir-emtricitabine-tenofovir AF (BIKTARVY) 50-200-25 MG TABS tablet 1 tablet    [provider]  Calcium 600-200 MG-UNIT tablet Take 1 tablet by mouth daily.    [provider]  carvedilol (COREG) 25 MG tablet Take 1 tablet (25 mg total) by mouth 2 (two) times daily with a meal. 04/11/18   Kathlen Mody, MD  FLUoxetine (PROZAC) 40 MG capsule Take 1 capsule (40 mg total) by mouth daily. 03/12/12   Gardiner Barefoot, MD  isosorbide mononitrate (IMDUR) 60 MG 24 hr tablet Take 1 tablet (60 mg total) by mouth daily. 04/11/18   Kathlen Mody, MD  LORazepam (ATIVAN)  1 MG tablet Take 1 mg by mouth at bedtime.    [provider]  LORazepam (ATIVAN) 1 MG tablet Take 1 tablet by mouth at bedtime as needed.    [provider]  Melatonin CR 3 MG TBCR Take 1 tablet by mouth daily.    [provider]  MOUNJARO 2.5 MG/0.5ML Pen SMARTSIG:2.5 Milligram(s) SUB-Q Once a Week 01/16/22   [provider]  Multiple Vitamins-Minerals (CENTRUM SILVER PO) Take 1 tablet by mouth daily.    [provider]  nitroGLYCERIN (NITROSTAT) 0.4 MG SL tablet Place under the tongue. 04/19/20   [provider]  Omega-3 Fatty Acids (FISH OIL) 1200 MG CPDR Take 2,400 mg by mouth daily.    [provider]  potassium chloride SA (KLOR-CON M) 20 MEQ tablet Take 2 tablets (40 mEq total) by mouth once for 1 dose. 02/05/22 02/05/22  Unk Lightning, PA  PREVIDENT 5000 BOOSTER PLUS 1.1 % PSTE Take by mouth. 04/11/21   [provider]  terbinafine (LAMISIL) 250 MG tablet Take 1 tablet (250 mg total) by mouth daily. 04/24/21   Comer, Belia Heman, MD  traMADol (ULTRAM) 50 MG tablet Take 50 mg by mouth as needed. 07/02/20   [provider]  valsartan-hydrochlorothiazide (DIOVAN-HCT) 160-25 MG tablet Take 1 tablet by mouth daily.    [provider]  varenicline (CHANTIX STARTING MONTH PAK) 0.5 MG  X 11 & 1 MG X 42 tablet Take one 0.5 mg tablet by mouth once daily for 3 days, then increase to one 0.5 mg tablet twice daily for 4 days, then increase to one 1 mg tablet twice daily. 12/23/19   Patwardhan, Anabel Bene, MD      Allergies    Isosorbide nitrate    Review of Systems   Review of Systems  Physical Exam Updated Vital Signs BP (!) 149/72 (BP Location: Left Arm)   Pulse 73   Temp 100 F (37.8 C) (Oral)   Resp 20   Ht 6' (1.829 m)   Wt 119.3 kg   SpO2 99%   BMI 35.67 kg/m  Physical Exam Vitals and nursing note reviewed.  Constitutional:      General: He is not in acute distress.    Appearance: He is  well-developed.  HENT:     Head: Normocephalic and atraumatic.     Right Ear: Tympanic membrane normal.     Left Ear: Tympanic membrane normal.     Nose: Congestion and rhinorrhea present.     Mouth/Throat:     Mouth: Mucous membranes are moist.     Pharynx: No oropharyngeal exudate or posterior oropharyngeal erythema.  Eyes:     Conjunctiva/sclera: Conjunctivae normal.     Pupils: Pupils are equal, round, and reactive to light.  Cardiovascular:     Rate and Rhythm: Normal rate and regular rhythm.     Heart sounds: No murmur heard. Pulmonary:     Effort: Pulmonary effort is normal. No respiratory distress.     Breath sounds: Normal breath sounds. No wheezing or rales.  Abdominal:     General: There is no distension.     Palpations: Abdomen is soft.     Tenderness: There is no abdominal tenderness. There is no guarding or rebound.  Musculoskeletal:        General: No tenderness. Normal range of motion.     Cervical back: Normal range of motion and neck supple.  Skin:    General: Skin is warm and dry.     Findings: No erythema or rash.  Neurological:     Mental Status: He is alert and oriented to person, place, and time.  Psychiatric:        Behavior: Behavior normal.     ED Results / Procedures / Treatments   Labs (all labs ordered are listed, but only abnormal results are displayed) Labs Reviewed  RESP PANEL BY RT-PCR (FLU A&B, COVID) ARPGX2 - Abnormal; Notable for the following components:      Result Value   SARS Coronavirus 2 by RT PCR POSITIVE (*)    All other components within normal limits  CBC - Abnormal; Notable for the following components:   RBC 4.16 (*)    All other components within normal limits  COMPREHENSIVE METABOLIC PANEL - Abnormal; Notable for the following components:   Potassium 3.4 (*)    Glucose, Bld 110 (*)    Creatinine, Ser 1.34 (*)    GFR, Estimated 58 (*)    All other components within normal limits    EKG None  Radiology DG Chest  2 View  Result Date: 07/14/2022 CLINICAL DATA:  Cough. EXAM: CHEST - 2 VIEW COMPARISON:  Chest x-ray 07/15/2006.  Chest x-ray 04/09/2018. FINDINGS: There is stable soft tissue prominence in the right paratracheal region. The cardiac silhouette is within normal limits. There is no focal lung infiltrate, pleural effusion or pneumothorax. No acute fractures  are seen. IMPRESSION: 1. No active cardiopulmonary disease. 2. Stable soft tissue prominence in the right paratracheal region. This can be further evaluated with nonemergent chest CT. Electronically Signed   By: Darliss Cheney M.D.   On: 07/14/2022 15:33    Procedures Procedures    Medications Ordered in ED Medications - No data to display  ED Course/ Medical Decision Making/ A&P                           Medical Decision Making  Pt with multiple medical problems and comorbidities and presenting today with a complaint that caries a high risk for morbidity and mortality.  Here today with URI type symptoms that have now been present for 6 days.  Patient is well-appearing on exam with oxygen sats of 99% with clear breath sounds throughout.  I independently interpreted patient's labs today.  He is COVID-positive, CBC without acute findings, CMP without acute findings.  I have independently visualized and interpreted pt's images today.  Chest x-ray without evidence of pneumonia at this time.  Findings were discussed with the patient.  He is outside of the window to receive antiviral's as his symptoms have now been present for 6 or more days.  He does not meet admission criteria at this time.  Discussed with him symptomatic treatment and return precautions.          Final Clinical Impression(s) / ED Diagnoses Final diagnoses:  COVID    Rx / DC Orders ED Discharge Orders          Ordered    benzonatate (TESSALON) 100 MG capsule  Every 8 hours        07/14/22 1706              Gwyneth Sprout, MD 07/14/22 1706

## 2022-07-14 NOTE — ED Triage Notes (Signed)
Headache, sore throat, nasal congestion, productive cough, chills, sweating, body aches, loose stools for the past 4 days.

## 2022-07-14 NOTE — Discharge Instructions (Signed)
No signs of pneumonia today.  You can use saline spray to hopefully help with your nasal congestion.  Try honey for the cough but you are also given Tessalon Perles you can try for the cough.  If you start having shortness of breath, vomiting, high fever and are getting worse and not better you should return to the emergency room.

## 2022-12-03 DIAGNOSIS — I2510 Atherosclerotic heart disease of native coronary artery without angina pectoris: Secondary | ICD-10-CM | POA: Diagnosis not present

## 2022-12-03 DIAGNOSIS — I10 Essential (primary) hypertension: Secondary | ICD-10-CM | POA: Diagnosis not present

## 2022-12-03 DIAGNOSIS — Z0000 Encounter for general adult medical examination without abnormal findings: Secondary | ICD-10-CM | POA: Diagnosis not present

## 2022-12-03 DIAGNOSIS — R7303 Prediabetes: Secondary | ICD-10-CM | POA: Diagnosis not present

## 2022-12-17 ENCOUNTER — Ambulatory Visit: Payer: 59 | Admitting: Cardiology

## 2023-01-01 ENCOUNTER — Other Ambulatory Visit (HOSPITAL_COMMUNITY)
Admission: RE | Admit: 2023-01-01 | Discharge: 2023-01-01 | Disposition: A | Discharge status: Home / Self Care | Payer: 59 | Source: Ambulatory Visit | Attending: Internal Medicine | Admitting: Internal Medicine

## 2023-01-01 ENCOUNTER — Ambulatory Visit (INDEPENDENT_AMBULATORY_CARE_PROVIDER_SITE_OTHER): Payer: 59 | Admitting: Internal Medicine

## 2023-01-01 ENCOUNTER — Encounter: Payer: Self-pay | Admitting: Internal Medicine

## 2023-01-01 ENCOUNTER — Other Ambulatory Visit: Payer: Self-pay

## 2023-01-01 VITALS — BP 150/80 | HR 54 | Temp 99.6°F | Resp 16 | Wt 257.6 lb

## 2023-01-01 DIAGNOSIS — F17290 Nicotine dependence, other tobacco product, uncomplicated: Secondary | ICD-10-CM | POA: Diagnosis not present

## 2023-01-01 DIAGNOSIS — B20 Human immunodeficiency virus [HIV] disease: Secondary | ICD-10-CM | POA: Insufficient documentation

## 2023-01-01 DIAGNOSIS — Z113 Encounter for screening for infections with a predominantly sexual mode of transmission: Secondary | ICD-10-CM | POA: Insufficient documentation

## 2023-01-01 DIAGNOSIS — F1090 Alcohol use, unspecified, uncomplicated: Secondary | ICD-10-CM | POA: Diagnosis not present

## 2023-01-01 DIAGNOSIS — K7030 Alcoholic cirrhosis of liver without ascites: Secondary | ICD-10-CM

## 2023-01-01 DIAGNOSIS — Z79899 Other long term (current) drug therapy: Secondary | ICD-10-CM

## 2023-01-01 DIAGNOSIS — F17200 Nicotine dependence, unspecified, uncomplicated: Secondary | ICD-10-CM

## 2023-01-01 NOTE — Assessment & Plan Note (Signed)
He is doing well on biktarvy and will recheck his labs to be sure that has continued.  Refills provided.  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   Will follow up in 6 months

## 2023-01-01 NOTE — Progress Notes (Signed)
   Subjective:    Patient ID: [name], male    DOB: 10/21/1955, 67 y.o.   MRN: [id number]  HPI Mr Hise is here for follow up of HIV He continues on Biktarvy with no missed doses.  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    Last visit with me was January 2023.  Missed some appointments during that time.  Has quit smoking, drinking much less.  Trying to lose weight.  No new issues otherwise.     Review of Systems  Constitutional:  Negative for fatigue.  Gastrointestinal:  Negative for diarrhea and nausea.  Skin:  Negative for rash.       Objective:   Physical Exam Eyes:     General: No scleral icterus. Pulmonary:     Effort: Pulmonary effort is normal.  Skin:    Findings: No rash.  Neurological:     Mental Status: He is alert.   SH: no tobacco        Assessment & Plan:

## 2023-01-01 NOTE — Assessment & Plan Note (Signed)
Will screen 

## 2023-01-01 NOTE — Assessment & Plan Note (Signed)
Will schedule for [facility] screening and will continue every 6 months.

## 2023-01-01 NOTE — Assessment & Plan Note (Signed)
Congratulated on quitting and he is doing very well now.

## 2023-01-02 LAB — T-HELPER CELL (CD4) - (RCID CLINIC ONLY)
CD4 % Helper T Cell: 33 % (ref 33–65)
CD4 T Cell Abs: 377 /uL — ABNORMAL LOW (ref 400–1790)

## 2023-01-03 LAB — URINE CYTOLOGY ANCILLARY ONLY
Chlamydia: NEGATIVE
Comment: NEGATIVE
Comment: NORMAL
Neisseria Gonorrhea: NEGATIVE

## 2023-01-05 LAB — CBC WITH DIFFERENTIAL/PLATELET
Absolute Monocytes: 292 cells/uL (ref 200–950)
Basophils Absolute: 29 cells/uL (ref 0–200)
Basophils Relative: 0.8 %
Eosinophils Absolute: 61 cells/uL (ref 15–500)
Eosinophils Relative: 1.7 %
HCT: 40.9 % (ref 38.5–50.0)
Hemoglobin: 14.4 g/dL (ref 13.2–17.1)
Lymphs Abs: 1192 cells/uL (ref 850–3900)
MCH: 33 pg (ref 27.0–33.0)
MCHC: 35.2 g/dL (ref 32.0–36.0)
MCV: 93.6 fL (ref 80.0–100.0)
MPV: 9.6 fL (ref 7.5–12.5)
Monocytes Relative: 8.1 %
Neutro Abs: 2027 cells/uL (ref 1500–7800)
Neutrophils Relative %: 56.3 %
Platelets: 183 10*3/uL (ref 140–400)
RBC: 4.37 10*6/uL (ref 4.20–5.80)
RDW: 13.1 % (ref 11.0–15.0)
Total Lymphocyte: 33.1 %
WBC: 3.6 10*3/uL — ABNORMAL LOW (ref 3.8–10.8)

## 2023-01-05 LAB — COMPLETE METABOLIC PANEL WITH GFR
AG Ratio: 1.5 (calc) (ref 1.0–2.5)
ALT: 43 U/L (ref 9–46)
AST: 30 U/L (ref 10–35)
Albumin: 4.3 g/dL (ref 3.6–5.1)
Alkaline phosphatase (APISO): 116 U/L (ref 35–144)
BUN: 9 mg/dL (ref 7–25)
CO2: 26 mmol/L (ref 20–32)
Calcium: 9.4 mg/dL (ref 8.6–10.3)
Chloride: 105 mmol/L (ref 98–110)
Creat: 1.24 mg/dL (ref 0.70–1.35)
Globulin: 2.9 g/dL (calc) (ref 1.9–3.7)
Glucose, Bld: 112 mg/dL — ABNORMAL HIGH (ref 65–99)
Potassium: 3.8 mmol/L (ref 3.5–5.3)
Sodium: 139 mmol/L (ref 135–146)
Total Bilirubin: 1 mg/dL (ref 0.2–1.2)
Total Protein: 7.2 g/dL (ref 6.1–8.1)
eGFR: 63 mL/min/{1.73_m2} (ref >=60)

## 2023-01-05 LAB — HIV-1 RNA QUANT-NO REFLEX-BLD
HIV 1 RNA Quant: NOT DETECTED Copies/mL
HIV-1 RNA Quant, Log: NOT DETECTED Log cps/mL

## 2023-01-05 LAB — LIPID PANEL
Cholesterol: 128 mg/dL (ref <200)
HDL: 60 mg/dL (ref >=40)
LDL Cholesterol (Calc): 51 mg/dL (calc)
Non-HDL Cholesterol (Calc): 68 mg/dL (calc) (ref <130)
Total CHOL/HDL Ratio: 2.1 (calc) (ref <5.0)
Triglycerides: 88 mg/dL (ref <150)

## 2023-01-05 LAB — RPR: RPR Ser Ql: NONREACTIVE

## 2023-01-11 ENCOUNTER — Ambulatory Visit: Payer: 59 | Admitting: Internal Medicine

## 2023-01-11 ENCOUNTER — Encounter: Payer: Self-pay | Admitting: Internal Medicine

## 2023-01-11 VITALS — BP 181/87 | HR 62 | Ht 72.0 in | Wt 260.0 lb

## 2023-01-11 DIAGNOSIS — I10 Essential (primary) hypertension: Secondary | ICD-10-CM

## 2023-01-11 DIAGNOSIS — R9431 Abnormal electrocardiogram [ECG] [EKG]: Secondary | ICD-10-CM | POA: Diagnosis not present

## 2023-01-11 DIAGNOSIS — Z01810 Encounter for preprocedural cardiovascular examination: Secondary | ICD-10-CM | POA: Diagnosis not present

## 2023-01-11 DIAGNOSIS — E782 Mixed hyperlipidemia: Secondary | ICD-10-CM | POA: Diagnosis not present

## 2023-01-11 DIAGNOSIS — I2510 Atherosclerotic heart disease of native coronary artery without angina pectoris: Secondary | ICD-10-CM | POA: Diagnosis not present

## 2023-01-11 MED ORDER — VALSARTAN-HYDROCHLOROTHIAZIDE 320-25 MG PO TABS: 1.0000 | ORAL_TABLET | Freq: Every day | ORAL | 3 refills | Status: DC

## 2023-01-11 MED ORDER — ASPIRIN 81 MG PO TBEC: 81.0000 mg | DELAYED_RELEASE_TABLET | Freq: Every day | ORAL | 3 refills | Status: AC

## 2023-01-11 MED ORDER — AMLODIPINE BESYLATE 10 MG PO TABS: 10.0000 mg | ORAL_TABLET | Freq: Every day | ORAL | 3 refills | Status: AC

## 2023-01-11 NOTE — Progress Notes (Unsigned)
Primary Physician/Referring:  Georgann Housekeeper, MD  Patient ID: Erik Frederick, male    DOB: September 11, 1955, 67 y.o.   MRN: [id number]  Chief Complaint  Patient presents with   Coronary artery disease involving native coronary artery of   Follow-up    Pre-op clearance    HPI:    Erik Frederick  is a 67 y.o. male with hypertension, CAD, liver cirrhosis due to hepatitis C, depression.   Initial consultation HPI 12/23/2019: Patient lives by himself, works for [employer] 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 He also has a flight of stairs inside his apartment. With this level of activity, he does not have any angina symptoms. He has mild stable exertional dyspnea that is unchanged. For the past 1 year, he has started smoking 1/2 pack a day. He has tried nicotine patch and vaping without any benefit. He has noticed retrosternal chest pain only while moving.   He has not been to our office since 2021. He refused cardiac catheterization even though he had an abnormal stress test at that time. His blood pressure is significantly elevated today. He is needing a colonoscopy but they are requesting cardiac clearance prior to this. Patient sometimes has chest pain that is random in occurence Denies shortness of breath, palpitations, diaphoresis, syncope, edema, PND, orthopnea.   Past Medical History:  Diagnosis Date   Anxiety    Cirrhosis (HCC)    Depression    GERD (gastroesophageal reflux disease)    Hepatitis C    High cholesterol    HIV infection (HCC)    Hypertension    Insomnia    Osteoarthritis    Pneumonia    Past Surgical History:  Procedure Laterality Date   CHOLECYSTECTOMY     Family History  Problem Relation Age of Onset   Heart disease Mother    Diabetes Mother 59   Gallstones Father    Alzheimer's disease Father        Dementia   Breast cancer Sister    Stomach cancer Neg Hx    Esophageal cancer Neg Hx    Colon polyps Neg Hx     Social  History   Tobacco Use   Smoking status: Former    Packs/day: 0.50    Years: 5.00    Additional pack years: 0.00    Total pack years: 2.50    Types: Cigarettes, E-cigarettes   Smokeless tobacco: Never   Tobacco comments:    states he is switching to vape pen currently  Substance Use Topics   Alcohol use: Yes    Alcohol/week: 21.0 standard drinks of alcohol    Types: 21 Glasses of wine per week    Comment: wine coolers   Marital Status: Married  ROS  Review of Systems  Cardiovascular:  Positive for chest pain.   Objective  Blood pressure (!) 181/87, pulse 62, height 6' (1.829 m), weight 260 lb (117.9 kg), SpO2 98 %. Body mass index is 35.26 kg/m.     01/11/2023   11:31 AM 01/11/2023   11:30 AM 01/01/2023    3:07 PM  Vitals with BMI  Height  6\' 0"    Weight  260 lbs 257 lbs 10 oz  BMI  35.25   Systolic 181 206 409  Diastolic 87 89 80  Pulse 62 61 54     Physical Exam Vitals reviewed.  HENT:     Head: Normocephalic and atraumatic.  Cardiovascular:     Rate  and Rhythm: Normal rate and regular rhythm.     Heart sounds: Normal heart sounds. No murmur heard. Pulmonary:     Effort: Pulmonary effort is normal.     Breath sounds: Normal breath sounds.  Abdominal:     General: Bowel sounds are normal.  Musculoskeletal:     Right lower leg: No edema.     Left lower leg: No edema.  Skin:    General: Skin is warm and dry.  Neurological:     Mental Status: He is alert.     Medications and allergies   Allergies  Allergen Reactions   Isosorbide Nitrate Other (See Comments)    Other Reaction(s): HA     Medication list after today's encounter   Current Outpatient Medications:    amLODipine (NORVASC) 10 MG tablet, Take 1 tablet (10 mg total) by mouth daily., Disp: 180 tablet, Rfl: 3   aspirin EC 81 MG tablet, Take 1 tablet (81 mg total) by mouth daily. Swallow whole., Disp: 90 tablet, Rfl: 3   atorvastatin (LIPITOR) 40 MG tablet, Take 1 tablet (40 mg total) by mouth  daily at 6 PM., Disp: 30 tablet, Rfl: 0   bictegravir-emtricitabine-tenofovir AF (BIKTARVY) 50-200-25 MG TABS tablet, Take 1 tablet by mouth daily., Disp: 30 tablet, Rfl: 5   Calcium 600-200 MG-UNIT tablet, Take 1 tablet by mouth daily., Disp: , Rfl:    carvedilol (COREG) 25 MG tablet, Take 1 tablet (25 mg total) by mouth 2 (two) times daily with a meal., Disp: 60 tablet, Rfl: 0   FLUoxetine (PROZAC) 40 MG capsule, Take 1 capsule (40 mg total) by mouth daily., Disp: 30 capsule, Rfl: 6   LORazepam (ATIVAN) 1 MG tablet, Take 1 mg by mouth at bedtime., Disp: , Rfl:    Multiple Vitamins-Minerals (CENTRUM SILVER PO), Take 1 tablet by mouth daily., Disp: , Rfl:    nitroGLYCERIN (NITROSTAT) 0.4 MG SL tablet, Place under the tongue., Disp: , Rfl:    Omega-3 Fatty Acids (FISH OIL) 1200 MG CPDR, Take 2,400 mg by mouth daily., Disp: , Rfl:    pantoprazole (PROTONIX) 40 MG tablet, Take 40 mg by mouth daily., Disp: , Rfl:    potassium chloride SA (KLOR-CON M) 20 MEQ tablet, Take 2 tablets (40 mEq total) by mouth once for 1 dose. (Patient taking differently: Take 40 mEq by mouth daily.), Disp: 2 tablet, Rfl: 0   PREVIDENT 5000 BOOSTER PLUS 1.1 % PSTE, Take by mouth., Disp: , Rfl:    valsartan-hydrochlorothiazide (DIOVAN HCT) 320-25 MG tablet, Take 1 tablet by mouth daily., Disp: 90 tablet, Rfl: 3   furosemide (LASIX) 20 MG tablet, Take 20 mg by mouth daily., Disp: , Rfl:    isosorbide mononitrate (IMDUR) 60 MG 24 hr tablet, Take 1 tablet (60 mg total) by mouth daily., Disp: 30 tablet, Rfl: 0  Laboratory examination:   Lab Results  Component Value Date   NA 139 01/01/2023   K 3.8 01/01/2023   CO2 26 01/01/2023   GLUCOSE 112 (H) 01/01/2023   BUN 9 01/01/2023   CREATININE 1.24 01/01/2023   CALCIUM 9.4 01/01/2023   EGFR 63 01/01/2023   GFRNONAA 58 (L) 07/14/2022       Latest Ref Rng & Units 01/01/2023    3:37 AM 07/14/2022    2:37 PM 02/05/2022   10:18 AM  CMP  Glucose 65 - 99 mg/dL 454  098  119    BUN 7 - 25 mg/dL 9  12  15    Creatinine  0.70 - 1.35 mg/dL 0.98  1.19  1.47   Sodium 135 - 146 mmol/L 139  138  136   Potassium 3.5 - 5.3 mmol/L 3.8  3.4  3.2   Chloride 98 - 110 mmol/L 105  104  101   CO2 20 - 32 mmol/L 26  24  26    Calcium 8.6 - 10.3 mg/dL 9.4  9.2  9.6   Total Protein 6.1 - 8.1 g/dL 7.2  7.8  7.8   Total Bilirubin 0.2 - 1.2 mg/dL 1.0  0.5  0.9   Alkaline Phos 38 - 126 U/L  109  91   AST 10 - 35 U/L 30  34  22   ALT 9 - 46 U/L 43  41  35       Latest Ref Rng & Units 01/01/2023    3:37 AM 07/14/2022    2:37 PM 02/05/2022   10:18 AM  CBC  WBC 3.8 - 10.8 Thousand/uL 3.6  5.3  4.3   Hemoglobin 13.2 - 17.1 g/dL 82.9  56.2  13.0   Hematocrit 38.5 - 50.0 % 40.9  40.3  39.2   Platelets 140 - 400 Thousand/uL 183  207  225.0     Lipid Panel Recent Labs    01/01/23 0337  CHOL 128  TRIG 88  LDLCALC 51  HDL 60  CHOLHDL 2.1    HEMOGLOBIN A1C No results found for: "HGBA1C", "MPG" TSH No results for input(s): "TSH" in the last 8760 hours.  External labs:  12/03/2022: hgbA1c 5.7 eAG 117 Hgb 13.8 hct 40.3 plts 231 Lipids were ordered but do not appear to have resulted   Radiology:    Cardiac Studies:   Nuclear stress test 03/2018: FINDINGS: Perfusion: Stress and rest images show a moderate size area of reversibility in the mid and distal portions of the anterolateral wall of the left ventricle. A small to moderate area of reversibility is also seen in the midportion of the septal wall. Wall Motion: Normal left ventricular wall motion. Mild to moderate left ventricular dilation. Left Ventricular Ejection Fraction: 51 % End diastolic volume 194 ml End systolic volume 96 ml IMPRESSION: 1. Reversal myocardial perfusion defects in the anterolateral and septal walls, suspicious for inducible myocardial ischemia. 2. Normal left ventricular wall motion. Mild to moderate left ventricular dilatation noted. 3. Left ventricular ejection fraction 51% 4. Non  invasive risk stratification*: High   ECHO 03/2018: - Left ventricle: The cavity size was normal. There was moderate    concentric hypertrophy. Systolic function was vigorous. The    estimated ejection fraction was in the range of 65% to 70%. Wall    motion was normal; there were no regional wall motion    abnormalities. Features are consistent with a pseudonormal left    ventricular filling pattern, with concomitant abnormal relaxation    and increased filling pressure (grade 2 diastolic dysfunction).    Doppler parameters are consistent with elevated ventricular    end-diastolic filling pressure.  - Aortic valve: Trileaflet; mildly thickened, mildly calcified    leaflets. Mean gradient (S): 8 mm Hg. Valve area (VTI): 2.11    cm^2. Valve area (Vmax): 2.16 cm^2. Valve area (Vmean): 2.09    cm^2.  - Mitral valve: There was mild regurgitation.  - Left atrium: The atrium was moderately dilated.  - Right ventricle: The cavity size was normal. Wall thickness was    normal. Systolic function was normal.  - Tricuspid valve: There was mild regurgitation.  -  Pulmonary arteries: Systolic pressure was within the normal    range.  - Inferior vena cava: The vessel was normal in size.  - Pericardium, extracardiac: There was no pericardial effusion.    EKG:   01/11/2023: Sinus Bradycardia, rate 53 bpm. Voltage criteria for LVH met. Cannot rule out old anteroseptal infarct. Inferolateral T wave inversion likely due to LVH. No significant change compared to 12/2019.  EKG 12/23/2019: Sinus rhythm 51 bpm. Left ventricular hypertrophy. IVCD. Inferolaterall T wave inversion likely due to LVH. No change compared to previous EKG in 2019.  Assessment     ICD-10-CM   1. Coronary artery disease involving native coronary artery of native heart without angina pectoris  I25.10 EKG 12-Lead    PCV ECHOCARDIOGRAM COMPLETE    PCV MYOCARDIAL PERFUSION WO LEXISCAN    Basic metabolic panel    2. Preop  cardiovascular exam  Z01.810 PCV ECHOCARDIOGRAM COMPLETE    PCV MYOCARDIAL PERFUSION WO LEXISCAN    Basic metabolic panel    3. Essential hypertension  I10 PCV ECHOCARDIOGRAM COMPLETE    PCV MYOCARDIAL PERFUSION WO LEXISCAN    Basic metabolic panel    4. Mixed hyperlipidemia  E78.2 PCV ECHOCARDIOGRAM COMPLETE    PCV MYOCARDIAL PERFUSION WO LEXISCAN    Basic metabolic panel    5. Nonspecific abnormal electrocardiogram (ECG) (EKG)  R94.31 Basic metabolic panel       Orders Placed This Encounter  Procedures   Basic metabolic panel   PCV MYOCARDIAL PERFUSION WO LEXISCAN    Standing Status:   Future    Standing Expiration Date:   03/13/2023   EKG 12-Lead   PCV ECHOCARDIOGRAM COMPLETE    Standing Status:   Future    Standing Expiration Date:   01/11/2024    Meds ordered this encounter  Medications   aspirin EC 81 MG tablet    Sig: Take 1 tablet (81 mg total) by mouth daily. Swallow whole.    Dispense:  90 tablet    Refill:  3   amLODipine (NORVASC) 10 MG tablet    Sig: Take 1 tablet (10 mg total) by mouth daily.    Dispense:  180 tablet    Refill:  3   valsartan-hydrochlorothiazide (DIOVAN HCT) 320-25 MG tablet    Sig: Take 1 tablet by mouth daily.    Dispense:  90 tablet    Refill:  3    Medications Discontinued During This Encounter  Medication Reason   bictegravir-emtricitabine-tenofovir AF (BIKTARVY) 50-200-25 MG TABS tablet    MOUNJARO 2.5 MG/0.5ML Pen    amLODipine (NORVASC) 5 MG tablet    valsartan-hydrochlorothiazide (DIOVAN-HCT) 160-25 MG tablet      Recommendations:   Erik Frederick is a 67 y.o.  male with CAD, HTN, HLD who is here for pre-op clearance  Coronary artery disease involving native coronary artery of native heart without angina pectoris Preop cardiovascular exam Abnormal EKG Will obtain stress test and echocardiogram Last echo and stress test were in 03/2018 and the stress test was abnormal If normal, patient may proceed with procedure Given  his HTN and HLD, I will obtain imaging prior to colonoscopy as he has not followed with cardiology in many years I will add baby ASA to his regiment.   Essential hypertension PCP just recently adjusted his medications I will double his amlodipine and increase his Diovan He is to keep a BP log and bring to next visit He is to get a BMP in 7-10 days Continue current cardiac medications.  Encourage low-sodium diet, less than 2000 mg daily.   Mixed hyperlipidemia On lipitor 40 mg PCP is managing Lipids last checked 11/2022     Clotilde Dieter, DO, [facility]  01/14/2023, 11:29 AM Office: [phone] Pager: [phone]

## 2023-01-21 ENCOUNTER — Other Ambulatory Visit: Payer: 59

## 2023-02-04 ENCOUNTER — Other Ambulatory Visit: Payer: 59

## 2023-02-06 ENCOUNTER — Other Ambulatory Visit: Payer: 59

## 2023-02-13 ENCOUNTER — Ambulatory Visit: Payer: 59 | Admitting: Cardiology

## 2023-06-04 ENCOUNTER — Ambulatory Visit: Payer: 59 | Attending: Internal Medicine | Admitting: Internal Medicine

## 2023-06-14 ENCOUNTER — Encounter: Payer: Self-pay | Admitting: Cardiology

## 2023-07-01 DIAGNOSIS — N182 Chronic kidney disease, stage 2 (mild): Secondary | ICD-10-CM | POA: Diagnosis not present

## 2023-07-01 DIAGNOSIS — M7989 Other specified soft tissue disorders: Secondary | ICD-10-CM | POA: Diagnosis not present

## 2023-07-01 DIAGNOSIS — I10 Essential (primary) hypertension: Secondary | ICD-10-CM | POA: Diagnosis not present

## 2023-07-01 DIAGNOSIS — I25118 Atherosclerotic heart disease of native coronary artery with other forms of angina pectoris: Secondary | ICD-10-CM | POA: Diagnosis not present

## 2023-07-02 ENCOUNTER — Other Ambulatory Visit: Payer: Self-pay

## 2023-07-02 ENCOUNTER — Ambulatory Visit: Payer: 59

## 2023-07-05 ENCOUNTER — Ambulatory Visit: Payer: 59 | Admitting: Cardiology

## 2023-08-15 ENCOUNTER — Telehealth: Payer: Self-pay

## 2023-08-15 NOTE — Telephone Encounter (Signed)
 Patient called to cancel US  that is scheduled for tomorrow. [facility]. Call transferred to [facility] to assist with cancellation.   Per chart review, liver US  completed 07/10/23 and results available.   Mayes Sangiovanni D Eilam Shrewsbury, RN

## 2023-08-16 ENCOUNTER — Other Ambulatory Visit: Payer: 59

## 2023-08-23 ENCOUNTER — Ambulatory Visit: Payer: 59 | Admitting: Internal Medicine

## 2023-08-30 ENCOUNTER — Ambulatory Visit: Payer: 59 | Attending: Physician Assistant | Admitting: Physician Assistant

## 2023-08-30 NOTE — Progress Notes (Deleted)
Cardiology Office Note:  .   Date:  08/30/2023  ID:  Erik Frederick, DOB Feb 22, 1956, MRN [id number] PCP: Georgann Housekeeper, MD  [facility] Providers Cardiologist:  Lesleigh Noe, MD (Inactive) {  History of Present Illness: .   Erik Frederick is a 68 y.o. male with past medical history of hypertension, CAD, liver cirrhosis due to hepatitis C, and depression here for follow-up appointment.  Initial consultation 12/23/2019.  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  He has a flight of stairs also inside his apartment.  With  this level of activity he does not have any anginal symptoms.  Mild stable exertional dyspnea is unchanged.  For the past year he had started smoking half a pack a day.  Tried nicotine patches and vaping without any benefit.  Also noticed retrosternal chest pain only while moving.  Was then seen this past summer 12/2022 and refused get a cardiac catheterization even though he had an abnormal stress test at that time.  Blood pressure significantly elevated at that appointment.  Needing a colonoscopy and requesting cardiac clearance.  Patient sometimes has chest pain that is random in occurrence.  Denies shortness of breath, palpitations, diaphoresis, syncope, edema, PND, orthopnea.  Today, he**  ROS: ***  Studies Reviewed: .        Nuclear stress test 03/2018: FINDINGS: Perfusion: Stress and rest images show a moderate size area of reversibility in the mid and distal portions of the anterolateral wall of the left ventricle. A small to moderate area of reversibility is also seen in the midportion of the septal wall. Wall Motion: Normal left ventricular wall motion. Mild to moderate left ventricular dilation. Left Ventricular Ejection Fraction: 51 % End diastolic volume 194 ml End systolic volume 96 ml IMPRESSION: 1. Reversal myocardial perfusion defects in the anterolateral and septal walls,  suspicious for inducible myocardial ischemia. 2. Normal left ventricular wall motion. Mild to moderate left ventricular dilatation noted. 3. Left ventricular ejection fraction 51% 4. Non invasive risk stratification*: High     ECHO 03/2018: - Left ventricle: The cavity size was normal. There was moderate    concentric hypertrophy. Systolic function was vigorous. The    estimated ejection fraction was in the range of 65% to 70%. Wall    motion was normal; there were no regional wall motion    abnormalities. Features are consistent with a pseudonormal left    ventricular filling pattern, with concomitant abnormal relaxation    and increased filling pressure (grade 2 diastolic dysfunction).    Doppler parameters are consistent with elevated ventricular    end-diastolic filling pressure.  - Aortic valve: Trileaflet; mildly thickened, mildly calcified    leaflets. Mean gradient (S): 8 mm Hg. Valve area (VTI): 2.11    cm^2. Valve area (Vmax): 2.16 cm^2. Valve area (Vmean): 2.09    cm^2.  - Mitral valve: There was mild regurgitation.  - Left atrium: The atrium was moderately dilated.  - Right ventricle: The cavity size was normal. Wall thickness was    normal. Systolic function was normal.  - Tricuspid valve: There was mild regurgitation.  - Pulmonary arteries: Systolic pressure was within the normal    range.  - Inferior vena cava: The vessel was normal in size.  - Pericardium, extracardiac: There was no pericardial effusion.    Risk Assessment/Calculations:   {Does this patient have ATRIAL FIBRILLATION?[phone]} No BP recorded.  {Refresh Note OR  Click here to enter BP  :1}***       Physical Exam:   VS:  There were no vitals taken for this visit.   Wt Readings from Last 3 Encounters:  01/11/23 260 lb (117.9 kg)  01/01/23 257 lb 9.6 oz (116.8 kg)  07/14/22 263 lb (119.3 kg)    GEN: Well nourished, well developed in no acute distress NECK: No JVD; No carotid bruits CARDIAC:  ***RRR, no murmurs, rubs, gallops RESPIRATORY:  Clear to auscultation without rales, wheezing or rhonchi  ABDOMEN: Soft, non-tender, non-distended EXTREMITIES:  No edema; No deformity   ASSESSMENT AND PLAN: .   CAD HTN Hyperlipidemia    {Are you ordering a CV Procedure (e.g. stress test, cath, DCCV, TEE, etc)?   Press F2        :[id number]}  Dispo: ***  Signed, Sharlene Dory, PA-C

## 2023-09-13 ENCOUNTER — Other Ambulatory Visit: Payer: Self-pay

## 2023-09-13 ENCOUNTER — Encounter: Payer: Self-pay | Admitting: Internal Medicine

## 2023-09-13 ENCOUNTER — Ambulatory Visit: Payer: 59 | Admitting: Internal Medicine

## 2023-09-13 VITALS — BP 150/77 | HR 73 | Resp 16 | Ht 72.0 in | Wt 268.0 lb

## 2023-09-13 DIAGNOSIS — B20 Human immunodeficiency virus [HIV] disease: Secondary | ICD-10-CM

## 2023-09-13 MED ORDER — BIKTARVY 50-200-25 MG PO TABS: 1.0000 | ORAL_TABLET | Freq: Every day | ORAL | 11 refills | Status: DC

## 2023-09-17 ENCOUNTER — Encounter: Payer: Self-pay | Admitting: Internal Medicine

## 2023-09-17 LAB — HIV-1 RNA QUANT-NO REFLEX-BLD
HIV 1 RNA Quant: NOT DETECTED {copies}/mL
HIV-1 RNA Quant, Log: NOT DETECTED {Log}

## 2023-09-17 LAB — T-HELPER CELLS (CD4) COUNT (NOT AT ARMC)
Absolute CD4: 433 {cells}/uL — ABNORMAL LOW (ref 490–1740)
CD4 T Helper %: 36 % (ref 30–61)
Total lymphocyte count: 1190 {cells}/uL (ref 850–3900)

## 2023-09-17 NOTE — Progress Notes (Signed)
   Subjective:    Patient ID: Erik Frederick, male    DOB: March 03, 1956, 68 y.o.   MRN: [id number]  HPI Erik Frederick is here for follow up of HIV He continues on Nason with no missed doses.  No issues with getting or taking his medication.  No complaints today.    Review of Systems  Constitutional:  Negative for fatigue.  Gastrointestinal:  Negative for diarrhea and nausea.  Skin:  Negative for rash.       Objective:   Physical Exam Eyes:     General: No scleral icterus. Pulmonary:     Effort: Pulmonary effort is normal.  Neurological:     Mental Status: He is alert.   SH: no tobacco        Assessment & Plan:

## 2023-10-03 ENCOUNTER — Other Ambulatory Visit: Payer: Self-pay | Admitting: Internal Medicine

## 2023-12-11 ENCOUNTER — Emergency Department (HOSPITAL_COMMUNITY)
Admission: EM | Admit: 2023-12-11 | Discharge: 2023-12-11 | Disposition: A | Discharge status: Home / Self Care | Attending: Emergency Medicine | Admitting: Emergency Medicine

## 2023-12-11 ENCOUNTER — Other Ambulatory Visit: Payer: Self-pay

## 2023-12-11 DIAGNOSIS — R600 Localized edema: Secondary | ICD-10-CM | POA: Diagnosis not present

## 2023-12-11 DIAGNOSIS — M79604 Pain in right leg: Secondary | ICD-10-CM | POA: Insufficient documentation

## 2023-12-11 DIAGNOSIS — M25561 Pain in right knee: Secondary | ICD-10-CM | POA: Insufficient documentation

## 2023-12-11 DIAGNOSIS — R001 Bradycardia, unspecified: Secondary | ICD-10-CM | POA: Insufficient documentation

## 2023-12-11 DIAGNOSIS — Z7982 Long term (current) use of aspirin: Secondary | ICD-10-CM | POA: Diagnosis not present

## 2023-12-11 LAB — CBC WITH DIFFERENTIAL/PLATELET
Abs Immature Granulocytes: 0.01 10*3/uL (ref 0.00–0.07)
Basophils Absolute: 0 10*3/uL (ref 0.0–0.1)
Basophils Relative: 1 %
Eosinophils Absolute: 0.1 10*3/uL (ref 0.0–0.5)
Eosinophils Relative: 2 %
HCT: 38.7 % — ABNORMAL LOW (ref 39.0–52.0)
Hemoglobin: 13.3 g/dL (ref 13.0–17.0)
Immature Granulocytes: 0 %
Lymphocytes Relative: 37 %
Lymphs Abs: 1.8 10*3/uL (ref 0.7–4.0)
MCH: 33 pg (ref 26.0–34.0)
MCHC: 34.4 g/dL (ref 30.0–36.0)
MCV: 96 fL (ref 80.0–100.0)
Monocytes Absolute: 0.5 10*3/uL (ref 0.1–1.0)
Monocytes Relative: 11 %
Neutro Abs: 2.3 10*3/uL (ref 1.7–7.7)
Neutrophils Relative %: 49 %
Platelets: 184 10*3/uL (ref 150–400)
RBC: 4.03 MIL/uL — ABNORMAL LOW (ref 4.22–5.81)
RDW: 13.1 % (ref 11.5–15.5)
WBC: 4.8 10*3/uL (ref 4.0–10.5)
nRBC: 0 % (ref 0.0–0.2)

## 2023-12-11 LAB — BASIC METABOLIC PANEL WITH GFR
Anion gap: 9 (ref 5–15)
BUN: 18 mg/dL (ref 8–23)
CO2: 24 mmol/L (ref 22–32)
Calcium: 9.3 mg/dL (ref 8.9–10.3)
Chloride: 106 mmol/L (ref 98–111)
Creatinine, Ser: 1.38 mg/dL — ABNORMAL HIGH (ref 0.61–1.24)
GFR, Estimated: 55 mL/min — ABNORMAL LOW (ref >60)
Glucose, Bld: 107 mg/dL — ABNORMAL HIGH (ref 70–99)
Potassium: 3.7 mmol/L (ref 3.5–5.1)
Sodium: 139 mmol/L (ref 135–145)

## 2023-12-11 MED ORDER — ENOXAPARIN SODIUM 120 MG/0.8ML IJ SOSY
1.0000 mg/kg | PREFILLED_SYRINGE | Freq: Once | INTRAMUSCULAR | Status: AC
  Administered 2023-12-11: 114 mg via SUBCUTANEOUS
  Filled 2023-12-11: qty 0.76

## 2023-12-11 NOTE — ED Triage Notes (Addendum)
 Pt came in for right knee pain for three weeks with no injury. Pt was on a flight for 10 hours but has had the knee pain prior. After the flight, pt stated the pain levels increased. [facility] stated [facility] to rule out a blood clot.

## 2023-12-11 NOTE — Discharge Instructions (Signed)
 Please return tomorrow and request for vascular ultrasound of your right lower extremity to rule out DVT.  It is best to return in the morning around 7-8:00

## 2023-12-11 NOTE — ED Provider Notes (Signed)
 [place] EMERGENCY DEPARTMENT AT [facility] Provider Note   CSN: [id number] Arrival date & time: 12/11/23  1722     History  Chief Complaint  Patient presents with   Knee Pain    Erik Frederick is a 68 y.o. male.  The history is provided by the patient and medical records. No language interpreter was used.  Knee Pain    68 year old male presents to the ED for right knee pain X 3 weeks. Patient states that he was sent here today by the [place]. He went to [place] 3 weeks ago and started having right knee pain on his 12 hour flight back home. The pain has been constant, burning and aching sensation. He always wears compression stockings bilaterally on long trips. He has taken tylenol  and lidocaine gel that the [place] gave him and has not improved his pain. He states when he drives or sits for long periods of time, he will have a painful sensation starting at the right knee and radiating to the posterior thigh to the right buttock lasting a few minutes. He states nothing makes the pain better, however, standing on the right leg is painful. He notes bilateral peripheral edema X 1 year. Denies CP, SOB, or N/V/D.   Home Medications Prior to Admission medications   Medication Sig Start Date End Date Taking? Authorizing Provider  amLODipine  (NORVASC ) 10 MG tablet Take 1 tablet (10 mg total) by mouth daily. 01/11/23 04/11/23  Custovic, Sabina, DO  aspirin  EC 81 MG tablet Take 1 tablet (81 mg total) by mouth daily. Swallow whole. 01/11/23   Custovic, Sabina, DO  atorvastatin  (LIPITOR) 40 MG tablet Take 1 tablet (40 mg total) by mouth daily at 6 PM. 04/11/18   Feliciana Horn, MD  bictegravir-emtricitabine -tenofovir  AF (BIKTARVY ) 50-200-25 MG TABS tablet Take 1 tablet by mouth daily. 09/13/23   Lina Render, MD  Calcium  600-200 MG-UNIT tablet Take 1 tablet by mouth daily.    [provider]  carvedilol  (COREG ) 25 MG tablet Take 1 tablet (25 mg total) by mouth 2 (two) times daily with  a meal. 04/11/18   Feliciana Horn, MD  FLUoxetine  (PROZAC ) 40 MG capsule Take 1 capsule (40 mg total) by mouth daily. Patient not taking: Reported on 09/13/2023 03/12/12   Lina Render, MD  furosemide (LASIX) 20 MG tablet Take 20 mg by mouth daily.    [provider]  isosorbide  mononitrate (IMDUR ) 60 MG 24 hr tablet Take 1 tablet (60 mg total) by mouth daily. 04/11/18   Feliciana Horn, MD  LORazepam  (ATIVAN ) 1 MG tablet Take 1 mg by mouth at bedtime. Patient not taking: Reported on 09/13/2023    [provider]  Multiple Vitamins-Minerals (CENTRUM SILVER PO) Take 1 tablet by mouth daily.    [provider]  nitroGLYCERIN  (NITROSTAT ) 0.4 MG SL tablet Place under the tongue. 04/19/20   [provider]  Omega-3 Fatty Acids (FISH OIL) 1200 MG CPDR Take 2,400 mg by mouth daily.    [provider]  pantoprazole (PROTONIX) 40 MG tablet Take 40 mg by mouth daily.    [provider]  potassium chloride  SA (KLOR-CON  M) 20 MEQ tablet Take 2 tablets (40 mEq total) by mouth once for 1 dose. Patient taking differently: Take 40 mEq by mouth daily. 02/05/22 01/11/23  Graciella Lavender, PA  PREVIDENT 5000 BOOSTER PLUS 1.1 % PSTE Take by mouth. 04/11/21   [provider]  valsartan -hydrochlorothiazide  (DIOVAN  HCT) 320-25 MG tablet  Take 1 tablet by mouth daily. 01/11/23   Custovic, Sabina, DO      Allergies    Isosorbide  nitrate    Review of Systems   Review of Systems  All other systems reviewed and are negative.   Physical Exam Updated Vital Signs BP (!) 154/87   Pulse (!) 54   Temp 98.4 F (36.9 C) (Oral)   Resp 18   Ht 6' (1.829 m)   Wt 112.9 kg   SpO2 98%   BMI 33.77 kg/m  Physical Exam Constitutional:      General: He is not in acute distress.    Appearance: He is well-developed.  HENT:     Head: Atraumatic.  Eyes:     Conjunctiva/sclera: Conjunctivae normal.  Cardiovascular:     Rate and Rhythm: Bradycardia present.      Pulses: Normal pulses.     Heart sounds: Normal heart sounds.  Pulmonary:     Effort: Pulmonary effort is normal.     Breath sounds: Normal breath sounds.  Abdominal:     Palpations: Abdomen is soft.     Tenderness: There is no abdominal tenderness.  Musculoskeletal:        General: Tenderness (RLE: mild tenderness to medial joint line of R knee.  no edema, erythema or warmth appreciated.  no joint laxity.  leg compartment soft, intact DP pulse) present.     Cervical back: Normal range of motion and neck supple.  Skin:    Capillary Refill: Capillary refill takes less than 2 seconds.     Findings: No rash.  Neurological:     Mental Status: He is alert.     ED Results / Procedures / Treatments   Labs (all labs ordered are listed, but only abnormal results are displayed) Labs Reviewed  CBC WITH DIFFERENTIAL/PLATELET - Abnormal; Notable for the following components:      Result Value   RBC 4.03 (*)    HCT 38.7 (*)    All other components within normal limits  BASIC METABOLIC PANEL WITH GFR - Abnormal; Notable for the following components:   Glucose, Bld 107 (*)    Creatinine, Ser 1.38 (*)    GFR, Estimated 55 (*)    All other components within normal limits    EKG None  Radiology No results found.  Procedures Procedures    Medications Ordered in ED Medications - No data to display  ED Course/ Medical Decision Making/ A&P                                 Medical Decision Making  BP (!) 154/87   Pulse (!) 54   Temp 98.4 F (36.9 C) (Oral)   Resp 18   Ht 6' (1.829 m)   Wt 112.9 kg   SpO2 98%   BMI 33.77 kg/m   50:50 PM  68 year old male presents to the ED for right knee pain X 3 weeks. Patient states that he was sent here today by the [place]. He went to [place] 3 weeks ago and started having right knee pain on his 12 hour flight back home. The pain has been constant, burning and aching sensation. He always wears compression stockings bilaterally on long trips. He  has taken tylenol  and lidocaine gel that the [place] gave him and has not improved his pain. He states when he drives or sits for long periods of time, he will have  a painful sensation starting at the right knee and radiating to the posterior thigh to the right buttock lasting a few minutes. He states nothing makes the pain better, however, standing on the right leg is painful. He notes bilateral peripheral edema X 1 year. Denies CP, SOB, or N/V/D.  On exam patient is well-appearing appears to be in no acute discomfort.  Right knee exam with tenderness to medial joint line but otherwise no evidence to suggest fracture or dislocation or any signs of infection.  -Labs ordered, independently viewed and interpreted by me.  Labs remarkable for Cr 1.38 -The patient was maintained on a cardiac monitor.  I personally viewed and interpreted the cardiac monitored which showed an underlying rhythm of: sinus brady -Imaging including vascular US  RLE ordered, pt to return tomorrow for testing -This patient presents to the ED for concern of leg pain, this involves an extensive number of treatment options, and is a complaint that carries with it a high risk of complications and morbidity.  The differential diagnosis includes MSK, strain, sprain, fx, dislocation, cellulitis, septic joint, DVT -Co morbidities that complicate the patient evaluation includes recent travel, HIV -Treatment includes lovenox  -Reevaluation of the patient after these medicines showed that the patient stayed the same -PCP office notes or outside notes reviewed -Escalation to admission/observation considered: patients will return tomorrow for vascular US           Final Clinical Impression(s) / ED Diagnoses Final diagnoses:  Leg pain, right    Rx / DC Orders ED Discharge Orders          Ordered    VAS US  LOWER EXTREMITY VENOUS (DVT)        12/11/23 2148              Debbra Fairy, PA-C 12/11/23 2149    Lind Repine,  MD 12/15/23 1142

## 2023-12-11 NOTE — ED Notes (Signed)
 Patient d/c with home care instructions.

## 2023-12-12 ENCOUNTER — Ambulatory Visit (HOSPITAL_COMMUNITY)
Admission: RE | Admit: 2023-12-12 | Discharge: 2023-12-12 | Disposition: A | Discharge status: Home / Self Care | Source: Ambulatory Visit | Attending: Emergency Medicine | Admitting: Emergency Medicine

## 2023-12-12 DIAGNOSIS — M7989 Other specified soft tissue disorders: Secondary | ICD-10-CM | POA: Diagnosis not present

## 2023-12-12 DIAGNOSIS — Z87898 Personal history of other specified conditions: Secondary | ICD-10-CM | POA: Insufficient documentation

## 2023-12-12 DIAGNOSIS — M79606 Pain in leg, unspecified: Secondary | ICD-10-CM | POA: Insufficient documentation

## 2023-12-12 NOTE — Progress Notes (Signed)
 Lower extremity venous duplex completed. Please see CV Procedures for preliminary results.  Estanislao Heimlich, RVT 12/12/23 12:48 PM

## 2023-12-25 NOTE — Progress Notes (Signed)
 The 10-year ASCVD risk score (Arnett DK, et al., 2019) is: 22%   Values used to calculate the score:     Age: 68 years     Sex: Male     Is Non-Hispanic African American: Yes     Diabetic: No     Tobacco smoker: No     Systolic Blood Pressure: 153 mmHg     Is BP treated: Yes     HDL Cholesterol: 59.8 mg/dL     Total Cholesterol: 150 mg/dL  Arlon Bergamo, BSN, RN

## 2024-03-03 DIAGNOSIS — I2510 Atherosclerotic heart disease of native coronary artery without angina pectoris: Secondary | ICD-10-CM | POA: Diagnosis not present

## 2024-03-03 DIAGNOSIS — Z0000 Encounter for general adult medical examination without abnormal findings: Secondary | ICD-10-CM | POA: Diagnosis not present

## 2024-03-03 DIAGNOSIS — M179 Osteoarthritis of knee, unspecified: Secondary | ICD-10-CM | POA: Diagnosis not present

## 2024-03-03 DIAGNOSIS — E785 Hyperlipidemia, unspecified: Secondary | ICD-10-CM | POA: Diagnosis not present

## 2024-03-03 DIAGNOSIS — N182 Chronic kidney disease, stage 2 (mild): Secondary | ICD-10-CM | POA: Diagnosis not present

## 2024-03-03 DIAGNOSIS — I10 Essential (primary) hypertension: Secondary | ICD-10-CM | POA: Diagnosis not present

## 2024-03-03 DIAGNOSIS — Z23 Encounter for immunization: Secondary | ICD-10-CM | POA: Diagnosis not present

## 2024-03-03 DIAGNOSIS — R7303 Prediabetes: Secondary | ICD-10-CM | POA: Diagnosis not present

## 2024-03-13 ENCOUNTER — Ambulatory Visit: Payer: 59 | Admitting: Internal Medicine

## 2024-03-16 ENCOUNTER — Ambulatory Visit: Admitting: Infectious Diseases

## 2024-05-13 ENCOUNTER — Ambulatory Visit

## 2024-06-01 ENCOUNTER — Ambulatory Visit: Admitting: Internal Medicine

## 2024-06-01 ENCOUNTER — Encounter: Payer: Self-pay | Admitting: Internal Medicine

## 2024-06-01 ENCOUNTER — Other Ambulatory Visit: Payer: Self-pay

## 2024-06-01 ENCOUNTER — Other Ambulatory Visit (HOSPITAL_COMMUNITY)
Admission: RE | Admit: 2024-06-01 | Discharge: 2024-06-01 | Disposition: A | Discharge status: Home / Self Care | Source: Ambulatory Visit | Attending: Internal Medicine | Admitting: Internal Medicine

## 2024-06-01 ENCOUNTER — Ambulatory Visit

## 2024-06-01 ENCOUNTER — Other Ambulatory Visit: Payer: Self-pay | Admitting: Internal Medicine

## 2024-06-01 VITALS — BP 151/70 | HR 54 | Temp 97.7°F | Ht 72.0 in | Wt 271.0 lb

## 2024-06-01 DIAGNOSIS — Z79899 Other long term (current) drug therapy: Secondary | ICD-10-CM

## 2024-06-01 DIAGNOSIS — B20 Human immunodeficiency virus [HIV] disease: Secondary | ICD-10-CM

## 2024-06-01 DIAGNOSIS — Z23 Encounter for immunization: Secondary | ICD-10-CM | POA: Diagnosis not present

## 2024-06-01 MED ORDER — BIKTARVY 50-200-25 MG PO TABS: 1.0000 | ORAL_TABLET | Freq: Every day | ORAL | 11 refills | Status: AC

## 2024-06-01 NOTE — Progress Notes (Signed)
 [facility]     HPI: Erik Frederick is a 68 y.o. male presents for HIV management. No missed doses of ART. Not sexually active sine last visit. He has been traveling([place]).  Used to be nurses aid.  Past Medical History:  Diagnosis Date   Anxiety    BMI 37.0-37.9, adult    CAD (coronary artery disease)    Chronic kidney disease (CKD), stage II (mild)    Cirrhosis (HCC)    Depression    ED (erectile dysfunction)    GERD (gastroesophageal reflux disease)    Hepatitis C    High cholesterol    HIV (human immunodeficiency virus infection) (HCC)    Hyperglycemia    Hyperlipidemia    Hypertension    Insomnia    Major depression in complete remission    Morbid obesity (HCC)    Osteoarthritis    Penile irritation    Pneumonia    Prediabetes    Pure hypercholesterolemia     Past Surgical History:  Procedure Laterality Date   CHOLECYSTECTOMY      Family History  Problem Relation Age of Onset   Heart disease Mother    Diabetes Mother 29   Gallstones Father    Alzheimer's disease Father        Dementia   Breast cancer Sister    Stomach cancer Neg Hx    Esophageal cancer Neg Hx    Colon polyps Neg Hx    Current Outpatient Medications on File Prior to Visit  Medication Sig Dispense Refill   amLODipine  (NORVASC ) 10 MG tablet Take 1 tablet (10 mg total) by mouth daily. 180 tablet 3   aspirin  EC 81 MG tablet Take 1 tablet (81 mg total) by mouth daily. Swallow whole. 90 tablet 3   atorvastatin  (LIPITOR) 40 MG tablet Take 1 tablet (40 mg total) by mouth daily at 6 PM. 30 tablet 0   bictegravir-emtricitabine -tenofovir  AF (BIKTARVY ) 50-200-25 MG TABS tablet Take 1 tablet by mouth daily. 30 tablet 11   Calcium  600-200 MG-UNIT tablet Take 1 tablet by mouth daily.     carvedilol  (COREG ) 25 MG tablet Take 1 tablet (25 mg total) by mouth 2 (two) times daily with a meal. 60 tablet 0   furosemide (LASIX) 20 MG tablet Take 20 mg by mouth daily.      hydrochlorothiazide  (HYDRODIURIL ) 25 MG tablet Take 25 mg by mouth daily.     isosorbide  mononitrate (IMDUR ) 60 MG 24 hr tablet Take 1 tablet (60 mg total) by mouth daily. 30 tablet 0   Multiple Vitamins-Minerals (CENTRUM SILVER PO) Take 1 tablet by mouth daily.     nitroGLYCERIN  (NITROSTAT ) 0.4 MG SL tablet Place under the tongue.     Omega-3 Fatty Acids (FISH OIL) 1200 MG CPDR Take 2,400 mg by mouth daily.     pantoprazole (PROTONIX) 40 MG tablet Take 40 mg by mouth daily.     potassium chloride  SA (KLOR-CON  M) 20 MEQ tablet Take 40 mEq by mouth 2 (two) times daily.     PREVIDENT 5000 BOOSTER PLUS 1.1 % PSTE Take by mouth.     sertraline (ZOLOFT) 100 MG tablet 50 mg.     valsartan  (DIOVAN ) 160 MG tablet Take 160 mg by mouth daily.     No current facility-administered medications on file prior to visit.    Allergies  Allergen Reactions   Isosorbide  Nitrate Other (See Comments)    Other Reaction(s): HA  Lab Results HIV 1 RNA Quant (Copies/mL)  Date Value  09/13/2023 Not Detected  01/01/2023 Not Detected  08/17/2021 Not Detected   CD4 T Cell Abs (/uL)  Date Value  01/01/2023 377 (L)  08/17/2021 378 (L)  04/24/2021 512   No results found for: HIV1GENOSEQ Lab Results  Component Value Date   WBC 4.8 12/11/2023   HGB 13.3 12/11/2023   HCT 38.7 (L) 12/11/2023   MCV 96.0 12/11/2023   PLT 184 12/11/2023    Lab Results  Component Value Date   CREATININE 1.38 (H) 12/11/2023   BUN 18 12/11/2023   NA 139 12/11/2023   K 3.7 12/11/2023   CL 106 12/11/2023   CO2 24 12/11/2023   Lab Results  Component Value Date   ALT 43 01/01/2023   AST 30 01/01/2023   ALKPHOS 109 07/14/2022   BILITOT 1.0 01/01/2023    Lab Results  Component Value Date   CHOL 128 01/01/2023   TRIG 88 01/01/2023   HDL 60 01/01/2023   LDLCALC 51 01/01/2023   No results found for: HAV Lab Results  Component Value Date   HEPBSAG NEGATIVE 10/15/2014   HEPBSAB POS (A) 10/15/2014   Lab  Results  Component Value Date   HCVAB REACTIVE (A) 10/20/2009   Lab Results  Component Value Date   CHLAMYDIAWP Negative 01/01/2023   N Negative 01/01/2023   No results found for: GCPROBEAPT No results found for: QUANTGOLD  Assessment/Plan #HIV -CD4 433, VLnd, on 09/13/23 -continue biktarvy  -F/U in 6months     #Vaccina-tion COVID today Flu-today Monkeypox PCV-needs Meningitis-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     Loney Stank, MD [facility] I have personally spent 41 minutes involved in face-to-face and non-face-to-face activities for this patient on the day of the visit. Professional time spent includes the following activities: Preparing to see the patient (review of tests), Obtaining and/or reviewing separately obtained history (admission/discharge record), Performing a medically appropriate examination and/or evaluation , Ordering medications/tests/procedures, referring and communicating with other health care professionals, Documenting clinical information in the EMR, Independently interpreting results (not separately reported), Communicating results to the patient/family/caregiver, Counseling and educating the patient/family/caregiver and Care coordination (not separately reported).

## 2024-06-02 LAB — T-HELPER CELLS (CD4) COUNT (NOT AT ARMC)
CD4 % Helper T Cell: 35 % (ref 33–65)
CD4 T Cell Abs: 413 /uL (ref 400–1790)

## 2024-06-03 LAB — URINE CYTOLOGY ANCILLARY ONLY
Chlamydia: NEGATIVE
Comment: NEGATIVE
Comment: NORMAL
Neisseria Gonorrhea: NEGATIVE

## 2024-06-04 LAB — CBC WITH DIFFERENTIAL/PLATELET
Absolute Lymphocytes: 1260 {cells}/uL (ref 850–3900)
Absolute Monocytes: 392 {cells}/uL (ref 200–950)
Basophils Absolute: 18 {cells}/uL (ref 0–200)
Basophils Relative: 0.4 %
Eosinophils Absolute: 122 {cells}/uL (ref 15–500)
Eosinophils Relative: 2.7 %
HCT: 38.4 % — ABNORMAL LOW (ref 38.5–50.0)
Hemoglobin: 13.3 g/dL (ref 13.2–17.1)
MCH: 33.1 pg — ABNORMAL HIGH (ref 27.0–33.0)
MCHC: 34.6 g/dL (ref 32.0–36.0)
MCV: 95.5 fL (ref 80.0–100.0)
MPV: 9.2 fL (ref 7.5–12.5)
Monocytes Relative: 8.7 %
Neutro Abs: 2709 {cells}/uL (ref 1500–7800)
Neutrophils Relative %: 60.2 %
Platelets: 225 Thousand/uL (ref 140–400)
RBC: 4.02 Million/uL — ABNORMAL LOW (ref 4.20–5.80)
RDW: 13.9 % (ref 11.0–15.0)
Total Lymphocyte: 28 %
WBC: 4.5 Thousand/uL (ref 3.8–10.8)

## 2024-06-04 LAB — HEPATITIS C ANTIBODY: Hepatitis C Ab: REACTIVE — AB

## 2024-06-04 LAB — LIPID PANEL
Cholesterol: 140 mg/dL (ref <200)
HDL: 65 mg/dL (ref >=40)
LDL Cholesterol (Calc): 49 mg/dL
Non-HDL Cholesterol (Calc): 75 mg/dL (ref <130)
Total CHOL/HDL Ratio: 2.2 (calc) (ref <5.0)
Triglycerides: 183 mg/dL — ABNORMAL HIGH (ref <150)

## 2024-06-04 LAB — COMPLETE METABOLIC PANEL WITHOUT GFR
AG Ratio: 1.6 (calc) (ref 1.0–2.5)
ALT: 32 U/L (ref 9–46)
AST: 29 U/L (ref 10–35)
Albumin: 4.5 g/dL (ref 3.6–5.1)
Alkaline phosphatase (APISO): 100 U/L (ref 35–144)
BUN: 9 mg/dL (ref 7–25)
CO2: 29 mmol/L (ref 20–32)
Calcium: 9.3 mg/dL (ref 8.6–10.3)
Chloride: 103 mmol/L (ref 98–110)
Creat: 1.15 mg/dL (ref 0.70–1.35)
Globulin: 2.8 g/dL (ref 1.9–3.7)
Glucose, Bld: 144 mg/dL — ABNORMAL HIGH (ref 65–99)
Potassium: 3.7 mmol/L (ref 3.5–5.3)
Sodium: 139 mmol/L (ref 135–146)
Total Bilirubin: 0.8 mg/dL (ref 0.2–1.2)
Total Protein: 7.3 g/dL (ref 6.1–8.1)

## 2024-06-04 LAB — RPR: RPR Ser Ql: NONREACTIVE

## 2024-06-04 LAB — QUANTIFERON-TB GOLD PLUS
Mitogen-NIL: 3.46 [IU]/mL
NIL: 0.02 [IU]/mL
QuantiFERON-TB Gold Plus: NEGATIVE
TB1-NIL: 0.01 [IU]/mL
TB2-NIL: 0.01 [IU]/mL

## 2024-06-04 LAB — HCV RNA,QUANTITATIVE REAL TIME PCR
HCV Quantitative Log: <1.18 {Log_IU}/mL — AB
HCV RNA, PCR, QN: <15 [IU]/mL — AB

## 2024-06-04 LAB — HIV-1 RNA QUANT-NO REFLEX-BLD
HIV 1 RNA Quant: NOT DETECTED {copies}/mL
HIV-1 RNA Quant, Log: NOT DETECTED {Log_copies}/mL

## 2024-09-01 ENCOUNTER — Ambulatory Visit: Attending: Cardiology | Admitting: Cardiology

## 2024-09-01 ENCOUNTER — Encounter: Payer: Self-pay | Admitting: Cardiology

## 2024-09-01 VITALS — BP 124/57 | HR 55 | Ht 72.0 in | Wt 277.0 lb

## 2024-09-01 DIAGNOSIS — I25118 Atherosclerotic heart disease of native coronary artery with other forms of angina pectoris: Secondary | ICD-10-CM | POA: Diagnosis not present

## 2024-09-01 DIAGNOSIS — E782 Mixed hyperlipidemia: Secondary | ICD-10-CM | POA: Insufficient documentation

## 2024-09-01 DIAGNOSIS — I10 Essential (primary) hypertension: Secondary | ICD-10-CM

## 2024-09-01 NOTE — Progress Notes (Signed)
 " Cardiology Office Note:  .   Date:  09/01/2024  ID:  Erik Frederick Fell, DOB 05/16/1956, MRN [id number] PCP: Erik Denece DELENA, DO  [facility] Providers Cardiologist:  Newman Lawrence, MD PCP: Erik Denece DELENA, DO  Chief Complaint  Patient presents with   Chest Pain     Erik Frederick is a 69 y.o. male with hypertension, CAD, liver cirrhosis due to hepatitis C, depression, OSA on CPAP   Discussed the use of AI scribe software for clinical note transcription with the patient, who gave verbal consent to proceed.  History of Present Illness Erik Frederick is a 69 year old male with hypertension, coronary artery disease, and liver cirrhosis who presents for evaluation of chest pain.  In 2021, he had chest pain and dizziness while preparing for a trip, which resolved after 15 minutes of rest in his car and led to an emergency room visit. He has not had recurrent chest pain except during the holidays when his blood pressure was elevated, which he associates with recent weight gain. His current weight is 275 pounds, his highest to date.  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 In April last year he developed marked fatigue, chest discomfort, and leg pain while [place] airport. He reports his current activity level is lower than at that time.  He takes carvedilol  and isosorbide . His last stress test was in 2019 and was abnormal. He has never had a heart catheterization.  He started CPAP for sleep apnea about a month ago. He is still adjusting and sometimes removes it during the night without realizing it.      Vitals:   09/01/24 1430  BP: (!) 124/57  Pulse: (!) 55  SpO2: 97%      Review of Systems  Cardiovascular:  Positive for chest pain. Negative for dyspnea on exertion, leg swelling, palpitations and syncope.        Studies Reviewed: SABRA        EKG 09/01/2024: Sinus  bradycardia with 1st degree A-V block Left ventricular hypertrophy with QRS widening and repolarization abnormality ( R in aVL , Sokolow-Lyon , Cornell product , Romhilt-Estes ) When compared with ECG of 10-Apr-2018 05:10, No significant change was found  Labs 05/2024: Chol 140, TG 183, HDL 65, LDL 49 Hb 13.3 Cr 8.84    Physical Exam Vitals and nursing note reviewed.  Constitutional:      General: He is not in acute distress.    Appearance: He is obese.  Neck:     Vascular: No JVD.  Cardiovascular:     Rate and Rhythm: Normal rate and regular rhythm.     Heart sounds: Normal heart sounds. No murmur heard. Pulmonary:     Effort: Pulmonary effort is normal.     Breath sounds: Normal breath sounds. No wheezing or rales.  Musculoskeletal:     Right lower leg: No edema.     Left lower leg: No edema.      VISIT DIAGNOSES:   ICD-10-CM   1. Coronary artery disease of native artery of native heart with stable angina pectoris  I25.118 EKG 12-Lead    Basic metabolic panel with GFR    ECHOCARDIOGRAM COMPLETE    CT CORONARY MORPH W/CTA COR W/SCORE W/CA W/CM &/OR WO/CM    2. Essential hypertension  I10 EKG 12-Lead    3. Mixed hyperlipidemia  E78.2 EKG 12-Lead  [name] is a 69 y.o. male with hypertension, CAD, liver cirrhosis due to hepatitis C, depression, OSA on CPAP Assessment & Plan Coronary artery disease: Intermittent chest pain and dizziness, previously evaluated with abnormal stress test in 2019. Symptoms controlled with carvedilol  and isosorbide . Differential includes multivessel ischemia. - Ordered coronary CT angiogram to assess for coronary artery disease and calcium  score. - Ordered echocardiogram to evaluate heart function. - Scheduled follow-up in three months to discuss test results.  Essential hypertension: Blood pressure elevated due to dietary indiscretion and weight gain. Managed with carvedilol . - Continue carvedilol . - Encouraged dietary  modifications and weight loss.  Mixed hyperlipidemia: Cholesterol levels well controlled. - Continue current lipid-lowering therapy. - Ordered blood work to check kidney function before CT scan.  Obesity: Recent weight gain to 275 lbs, contributing to increased blood pressure and potential coronary artery disease risk. - Encouraged diet and lifestyle changes, including walking and low-calorie diet. - Suggested intermittent fasting for weight loss and glycemic control. - Discussed potential for insurance approval of medications like Wegovy or Ozempic if coronary artery disease is confirmed.     F/u in 3 months  Signed, Newman Erik Lawrence, MD  "

## 2024-09-01 NOTE — Patient Instructions (Addendum)
 " Lab Work: Bmp  If you have labs (blood work) drawn today and your tests are completely normal, you will receive your results only by: MyChart Message (if you have MyChart) OR A paper copy in the mail If you have any lab test that is abnormal or we need to change your treatment, we will call you to review the results.  Testing/Procedures: Echocardiogram   Your physician has requested that you have an echocardiogram. Echocardiography is a painless test that uses sound waves to create images of your heart. It provides your doctor with information about the size and shape of your heart and how well your hearts chambers and valves are working. This procedure takes approximately one hour. There are no restrictions for this procedure. Please do NOT wear cologne, perfume, aftershave, or lotions (deodorant is allowed). Please arrive 15 minutes prior to your appointment time.  Please note: We ask at that you not bring children with you during ultrasound (echo/ vascular) testing. Due to room size and safety concerns, children are not allowed in the ultrasound rooms during exams. Our front office staff cannot provide observation of children in our lobby area while testing is being conducted. An adult accompanying a patient to their appointment will only be allowed in the ultrasound room at the discretion of the ultrasound technician under special circumstances. We apologize for any inconvenience.  Coronary CTA  Your physician has requested that you have cardiac CT. Cardiac computed tomography (CT) is a painless test that uses an x-ray machine to take clear, detailed pictures of your heart. For further information please visit [email or web address] Please follow instruction sheet as given.    Follow-Up: At [facility], you and your health needs are our priority.  As part of our continuing mission to provide you with exceptional heart care, our providers are all part of one team.  This team  includes your primary Cardiologist (physician) and Advanced Practice Providers or APPs (Physician Assistants and Nurse Practitioners) who all work together to provide you with the care you need, when you need it.  Your next appointment:    Monday 4/13 at 1:30 pm 3 month(s)  Provider:   Newman JINNY Lawrence, MD          Your cardiac CT will be scheduled at one of the below locations:   [facility] [address] [place], [place] [address] [phone] (Severe contrast allergies only)  OR   [facility] [address] [place], [place] [address] [phone]  OR   [facility] [address] [place], [place] [address] [phone]  OR   [facility] [address]  [place], [place] [address]  OR   [facility] [address] [place], [place] [phone]  If scheduled at [facility], please arrive at the [facility] Entrance (Entrance C2) of [facility] 30 minutes prior to test start time. You can use the FREE valet parking offered at entrance C (encouraged to control the heart rate for the test)  Proceed to the [facility] Radiology Department (first floor) to check-in and test prep.  All radiology patients and guests should use entrance C2 at [facility], accessed from [facility], even though the hospital's physical address listed is [address].  If scheduled at the Heart and Vascular Tower at [address], please enter the parking lot using the [address] entrance and use the FREE  valet service at the patient drop-off area. Enter the building and check-in with registration on the main floor.  If scheduled at [facility], please arrive to the Heart and Vascular Center 15 mins early for check-in and test prep.  There is spacious parking and easy access to the radiology department from the [facility]  Heart and Vascular entrance. Please enter here and check-in with the desk attendant.   If scheduled at [facility], please arrive 30 minutes early for check-in and test prep.  Please follow these instructions carefully (unless otherwise directed):  An IV will be required for this test and Nitroglycerin  will be given.  Hold all erectile dysfunction medications at least 3 days (72 hrs) prior to test. (Ie viagra, cialis, sildenafil, tadalafil, etc)   On the Night Before the Test: Be sure to Drink plenty of water. Do not consume any caffeinated/decaffeinated beverages or chocolate 12 hours prior to your test. Do not take any antihistamines 12 hours prior to your test.    On the Day of the Test: Drink plenty of water until 1 hour prior to the test. Do not eat any food 1 hour prior to test. You may take your regular medications prior to the test.  Take metoprolol (Lopressor) two hours prior to test. If you take Furosemide/Hydrochlorothiazide /Spironolactone/Chlorthalidone, please HOLD on the morning of the test.        After the Test: Drink plenty of water. After receiving IV contrast, you may experience a mild flushed feeling. This is normal. On occasion, you may experience a mild rash up to 24 hours after the test. This is not dangerous. If this occurs, you can take Benadryl 25 mg, Zyrtec, Claritin, or Allegra and increase your fluid intake. (Patients taking Tikosyn should avoid Benadryl, and may take Zyrtec, Claritin, or Allegra) If you experience trouble breathing, this can be serious. If it is severe call 911 IMMEDIATELY. If it is mild, please call our office.  We will call to schedule your test 2-4 weeks out understanding that some insurance companies will need an authorization prior to the service being performed.   For more information and frequently asked questions, please visit our website : [email or web address]  For non-scheduling related questions, please  contact the cardiac imaging nurse navigator should you have any questions/concerns: Cardiac Imaging Nurse Navigators Direct Office Dial: [phone]   For scheduling needs, including cancellations and rescheduling, please call Brittany, [phone].           "

## 2024-09-02 LAB — BASIC METABOLIC PANEL WITH GFR
BUN/Creatinine Ratio: 10 (ref 10–24)
BUN: 12 mg/dL (ref 8–27)
CO2: 23 mmol/L (ref 20–29)
Calcium: 9.1 mg/dL (ref 8.6–10.2)
Chloride: 103 mmol/L (ref 96–106)
Creatinine, Ser: 1.22 mg/dL (ref 0.76–1.27)
Glucose: 134 mg/dL — ABNORMAL HIGH (ref 70–99)
Potassium: 3.9 mmol/L (ref 3.5–5.2)
Sodium: 141 mmol/L (ref 134–144)
eGFR: 64 mL/min/1.73

## 2024-09-18 ENCOUNTER — Encounter (HOSPITAL_COMMUNITY): Payer: Self-pay

## 2024-09-22 ENCOUNTER — Ambulatory Visit (HOSPITAL_COMMUNITY)

## 2024-09-30 ENCOUNTER — Ambulatory Visit (HOSPITAL_COMMUNITY)

## 2024-11-23 ENCOUNTER — Ambulatory Visit: Admitting: Cardiology
